# Patient Record
Sex: Female | Born: 1990 | Hispanic: Yes | Marital: Married | Smoking: Never smoker
Health system: Southern US, Community
[De-identification: ages and names within clinical notes are randomized; demographics above are authoritative.]

## PROBLEM LIST (undated history)

## (undated) DIAGNOSIS — Z789 Other specified health status: Secondary | ICD-10-CM

## (undated) DIAGNOSIS — F32A Depression, unspecified: Secondary | ICD-10-CM

## (undated) HISTORY — DX: Depression, unspecified: F32.A

---

## 2016-12-17 LAB — OB RESULTS CONSOLE HEPATITIS B SURFACE ANTIGEN: HEP B S AG: NEGATIVE

## 2016-12-17 LAB — OB RESULTS CONSOLE GC/CHLAMYDIA
Chlamydia: NEGATIVE
Gonorrhea: NEGATIVE

## 2016-12-17 LAB — OB RESULTS CONSOLE VARICELLA ZOSTER ANTIBODY, IGG: Varicella: IMMUNE

## 2016-12-17 LAB — OB RESULTS CONSOLE HIV ANTIBODY (ROUTINE TESTING): HIV: NONREACTIVE

## 2016-12-17 LAB — OB RESULTS CONSOLE RPR: RPR: NONREACTIVE

## 2016-12-19 DIAGNOSIS — Z6791 Unspecified blood type, Rh negative: Secondary | ICD-10-CM | POA: Insufficient documentation

## 2017-04-20 ENCOUNTER — Other Ambulatory Visit: Payer: Self-pay | Admitting: Obstetrics and Gynecology

## 2017-04-20 DIAGNOSIS — Z3689 Encounter for other specified antenatal screening: Secondary | ICD-10-CM

## 2017-04-30 ENCOUNTER — Ambulatory Visit
Admission: RE | Admit: 2017-04-30 | Discharge: 2017-04-30 | Disposition: A | Discharge status: Home / Self Care | Payer: Managed Care, Other (non HMO) | Source: Ambulatory Visit | Attending: Obstetrics and Gynecology | Admitting: Obstetrics and Gynecology

## 2017-04-30 ENCOUNTER — Ambulatory Visit: Payer: Self-pay

## 2017-04-30 DIAGNOSIS — O321XX0 Maternal care for breech presentation, not applicable or unspecified: Secondary | ICD-10-CM | POA: Insufficient documentation

## 2017-04-30 DIAGNOSIS — Z3689 Encounter for other specified antenatal screening: Secondary | ICD-10-CM | POA: Insufficient documentation

## 2017-04-30 DIAGNOSIS — Z3A29 29 weeks gestation of pregnancy: Secondary | ICD-10-CM | POA: Diagnosis not present

## 2017-04-30 HISTORY — DX: Other specified health status: Z78.9

## 2017-07-02 LAB — OB RESULTS CONSOLE GBS: GBS: NEGATIVE

## 2017-07-16 ENCOUNTER — Other Ambulatory Visit: Payer: Self-pay | Admitting: Obstetrics and Gynecology

## 2017-07-19 ENCOUNTER — Inpatient Hospital Stay
Admission: EM | Admit: 2017-07-19 | Discharge: 2017-07-24 | DRG: 765 | Disposition: A | Discharge status: Home / Self Care | Payer: Managed Care, Other (non HMO) | Attending: Obstetrics and Gynecology | Admitting: Obstetrics and Gynecology

## 2017-07-19 DIAGNOSIS — Z6791 Unspecified blood type, Rh negative: Secondary | ICD-10-CM | POA: Diagnosis not present

## 2017-07-19 DIAGNOSIS — O26893 Other specified pregnancy related conditions, third trimester: Secondary | ICD-10-CM | POA: Diagnosis present

## 2017-07-19 DIAGNOSIS — D6959 Other secondary thrombocytopenia: Secondary | ICD-10-CM | POA: Diagnosis not present

## 2017-07-19 DIAGNOSIS — Z3A41 41 weeks gestation of pregnancy: Secondary | ICD-10-CM | POA: Diagnosis not present

## 2017-07-19 DIAGNOSIS — O9081 Anemia of the puerperium: Secondary | ICD-10-CM | POA: Diagnosis not present

## 2017-07-19 DIAGNOSIS — D62 Acute posthemorrhagic anemia: Secondary | ICD-10-CM | POA: Diagnosis not present

## 2017-07-19 DIAGNOSIS — O480 Post-term pregnancy: Secondary | ICD-10-CM | POA: Diagnosis present

## 2017-07-19 DIAGNOSIS — Z9889 Other specified postprocedural states: Secondary | ICD-10-CM

## 2017-07-19 LAB — CBC
HEMATOCRIT: 37.7 % (ref 35.0–47.0)
HEMOGLOBIN: 12.7 g/dL (ref 12.0–16.0)
MCH: 26.1 pg (ref 26.0–34.0)
MCHC: 33.7 g/dL (ref 32.0–36.0)
MCV: 77.5 fL — AB (ref 80.0–100.0)
Platelets: 191 10*3/uL (ref 150–440)
RBC: 4.86 MIL/uL (ref 3.80–5.20)
RDW: 16.4 % — ABNORMAL HIGH (ref 11.5–14.5)
WBC: 6.1 10*3/uL (ref 3.6–11.0)

## 2017-07-19 LAB — TYPE AND SCREEN
ABO/RH(D): B NEG
Antibody Screen: NEGATIVE

## 2017-07-19 MED ORDER — LIDOCAINE HCL (PF) 1 % IJ SOLN
INTRAMUSCULAR | Status: AC
  Filled 2017-07-19: qty 30

## 2017-07-19 MED ORDER — OXYTOCIN 10 UNIT/ML IJ SOLN
INTRAMUSCULAR | Status: AC
  Filled 2017-07-19: qty 2

## 2017-07-19 MED ORDER — AMMONIA AROMATIC IN INHA
RESPIRATORY_TRACT | Status: AC
  Filled 2017-07-19: qty 10

## 2017-07-19 MED ORDER — LACTATED RINGERS IV SOLN
INTRAVENOUS | Status: DC
  Administered 2017-07-19 – 2017-07-20 (×3): via INTRAVENOUS
  Administered 2017-07-20: 1000 mL via INTRAVENOUS
  Administered 2017-07-21 (×2): via INTRAVENOUS

## 2017-07-19 MED ORDER — TERBUTALINE SULFATE 1 MG/ML IJ SOLN: 0.2500 mg | Freq: Once | INTRAMUSCULAR | Status: DC | PRN

## 2017-07-19 MED ORDER — OXYTOCIN 40 UNITS IN LACTATED RINGERS INFUSION - SIMPLE MED
INTRAVENOUS | Status: AC
  Administered 2017-07-20: 2 m[IU]/min via INTRAVENOUS
  Filled 2017-07-19: qty 1000

## 2017-07-19 MED ORDER — MISOPROSTOL 200 MCG PO TABS
ORAL_TABLET | ORAL | Status: AC
  Filled 2017-07-19: qty 4

## 2017-07-19 MED ORDER — MISOPROSTOL 25 MCG QUARTER TABLET
25.0000 ug | ORAL_TABLET | ORAL | Status: DC | PRN
  Administered 2017-07-19 – 2017-07-20 (×4): 25 ug via VAGINAL
  Filled 2017-07-19 (×4): qty 1

## 2017-07-19 MED ORDER — BUTORPHANOL TARTRATE 2 MG/ML IJ SOLN: 1.0000 mg | INTRAMUSCULAR | Status: DC | PRN

## 2017-07-20 MED ORDER — SODIUM CHLORIDE FLUSH 0.9 % IV SOLN
INTRAVENOUS | Status: AC
  Filled 2017-07-20: qty 10

## 2017-07-20 MED ORDER — SODIUM CHLORIDE 0.9 % IJ SOLN
INTRAMUSCULAR | Status: AC
  Filled 2017-07-20: qty 50

## 2017-07-20 MED ORDER — TERBUTALINE SULFATE 1 MG/ML IJ SOLN: 0.2500 mg | Freq: Once | INTRAMUSCULAR | Status: DC | PRN

## 2017-07-20 MED ORDER — OXYTOCIN 40 UNITS IN LACTATED RINGERS INFUSION - SIMPLE MED
1.0000 m[IU]/min | INTRAVENOUS | Status: DC
  Administered 2017-07-20: 2 m[IU]/min via INTRAVENOUS
  Administered 2017-07-21: 30 m[IU]/min via INTRAVENOUS
  Administered 2017-07-21: 250 mL via INTRAVENOUS
  Filled 2017-07-20 (×2): qty 1000

## 2017-07-20 NOTE — Plan of Care (Signed)
MD ordered for patient to eat and place cytotec in an hour.

## 2017-07-20 NOTE — Progress Notes (Signed)
Isabella Bennett is a 26 y.o. G1P0 at [redacted]w[redacted]d by dating admitted for IOL for postdates.   Subjective: I am feeling a little something  Objective: BP 130/67 (BP Location: Right Arm)   Pulse 75   Temp 98.2 F (36.8 C) (Oral)   Resp 18   Ht 5\' 2"  (1.575 m)   Wt 74.8 kg (165 lb)   LMP 10/04/2016   BMI 30.18 kg/m  I/O last 3 completed shifts: In: 1252 [I.V.:1252] Out: -  No intake/output data recorded.  FHT: 135, Cat 1, +accels, no decels  UC:   q 1-3 mins, lasting 60-65 sec, palp as sl mod SVE:   Dilation: 3 Effacement (%): 80 Station: -3 Exam by:: ER RN   Labs: Lab Results  Component Value Date   WBC 6.1 07/19/2017   HGB 12.7 07/19/2017   HCT 37.7 07/19/2017   MCV 77.5 (L) 07/19/2017   PLT 191 07/19/2017    Assessment / Plan: A:1. IUP at 41 2/7 weeks 2. Foley bulb/Pitocin IOL 3. GBs neg P: Continue ext and fetal monitors. 2. Pitocin at 10 mun/min and Dr Ouida Sills is aware and agrees with this plan of care    [name] 07/20/2017, 11:05 PM

## 2017-07-20 NOTE — H&P (Signed)
Isabella Bennett is a 26 y.o. female presenting for post dates induction . Cytotec last pm .  Fetal pericardia effusion incidental , normal growth u/s  pelviectasis resolved, low lying placenta  Resolved  OB History    Gravida Para Term Preterm AB Living   1         0   SAB TAB Ectopic Multiple Live Births                 Past Medical History:  Diagnosis Date  . Medical history non-contributory    History reviewed. No pertinent surgical history. Family History: family history is not on file. Social History:  reports that she has never smoked. She has never used smokeless tobacco. She reports that she does not drink alcohol or use drugs.     Maternal Diabetes: No Genetic Screening: Declined Maternal Ultrasounds/Referrals: Normal Fetal Ultrasounds or other Referrals:  Other: as above  Maternal Substance Abuse:  No Significant Maternal Medications:  None Significant Maternal Lab Results:  None Other Comments:  None  ROS History Dilation: 1 Effacement (%): 50 Station: -3 Exam by:: RS Blood pressure 111/79, pulse 91, temperature 98.5 F (36.9 C), temperature source Oral, resp. rate 16, height 5\' 2"  (1.575 m), weight 74.8 kg (165 lb), last menstrual period 10/04/2016. Exam Physical Exam   0745  Lungs CTA  CV RRR  adb gravid  Reassuring fetal monitoring , ctx mildly painful q 2 minutes  cx high TFT /70 / -3 VTX  Prenatal labs: ABO, Rh: --/--/B NEG (09/16 2051) Antibody: NEG (09/16 2051) Rubella:   RPR: Nonreactive (02/14 0000)  HBsAg: Negative (02/14 0000)  HIV: Non-reactive (02/14 0000)  GBS: Negative (08/30 0000)   Assessment/Plan: postdates induction  Continue with induction    Isabella Bennett Isabella Bennett 07/20/2017, 7:43 AM

## 2017-07-20 NOTE — Progress Notes (Signed)
Isabella Bennett is a 25 y.o. G1P0 at [redacted]w[redacted]d by dating admitted for post-dates IOL. Cytotec being used per orders of Dr Palma Holter. Subjective: "I feel a little discomfort in my lower back"  Objective: BP 120/76 (BP Location: Right Arm)   Pulse 85   Temp 98 F (36.7 C)   Resp 16   Ht 5\' 2"  (1.575 m)   Wt 74.8 kg (165 lb)   LMP 10/04/2016   BMI 30.18 kg/m  No intake/output data recorded. Total I/O In: 1252 [I.V.:1252] Out: -   FHT:130, +accels, no decels, Cat 1 UC:  q1-3 mins,  SVE:   Dilation: Fingertip Effacement (%): 50 Station: -3 Exam by:: Schermerhorn  Labs: Lab Results  Component Value Date   WBC 6.1 07/19/2017   HGB 12.7 07/19/2017   HCT 37.7 07/19/2017   MCV 77.5 (L) 07/19/2017   PLT 191 07/19/2017    Assessment / Plan: A:1. IUP at 41 1/7 weeks 2. Early labor pattern P: Will place Cytotec at 1300 again since vtx is -3 station 2. Continue to monitor UC/FHT's  [name] 07/20/2017, 12:21 PM

## 2017-07-20 NOTE — Progress Notes (Signed)
Isabella Bennett is a 26 y.o. G1P0 at [redacted]w[redacted]d by dating  admitted for postdates IOL.   Subjective: I am feeling some of the contractions  Objective: BP 129/87 (BP Location: Right Arm)   Pulse 94   Temp 98 F (36.7 C) (Oral)   Resp 18   Ht 5\' 2"  (1.575 m)   Wt 74.8 kg (165 lb)   LMP 10/04/2016   BMI 30.18 kg/m  I/O last 3 completed shifts: In: 1252 [I.V.:1252] Out: -  No intake/output data recorded.  FHT: q 140, +accel, no decels, Cat 1 UC:   q 1-3 mins SVE:   Dilation: 4 Effacement (%): 90 Station: -3 Exam by:: C. Jones  Labs: Lab Results  Component Value Date   WBC 6.1 07/19/2017   HGB 12.7 07/19/2017   HCT 37.7 07/19/2017   MCV 77.5 (L) 07/19/2017   PLT 191 07/19/2017   2316 AROM for large amt of clear fluid. FHR 140's after AROM.   Assessment / Plan: A:1. IUP at 41 1/7 weeks. 2. GBS neg P: 1. Pitocin at 10 mun/min 2. IUPC inserted.   [name] 07/20/2017, 11:25 PM

## 2017-07-20 NOTE — Progress Notes (Signed)
Isabella Bennett is a 26 y.o. G1P0 at [redacted]w[redacted]d by dating  admitted fo IOL due to postdates. Pt is 41 1/7 weeks.  Subjective: Resting   Objective: BP 120/76 (BP Location: Right Arm)   Pulse 85   Temp 98 F (36.7 C)   Resp 16   Ht 5\' 2"  (1.575 m)   Wt 74.8 kg (165 lb)   LMP 10/04/2016   BMI 30.18 kg/m  No intake/output data recorded. Total I/O In: 1252 [I.V.:1252] Out: -   FHT: 140, accel to 155 UC:   q 1-5 mins SVE:   Dilation: Fingertip Effacement (%): 50 Station: -3 Exam by:: Schermerhorn  Labs: Lab Results  Component Value Date   WBC 6.1 07/19/2017   HGB 12.7 07/19/2017   HCT 37.7 07/19/2017   MCV 77.5 (L) 07/19/2017   PLT 191 07/19/2017    Assessment / Plan: A:1. IUP at 41 1/7 weeks for IOL 2. GBs neg  P: Continue Cytotec as scheduled.  2. Monitor UC/FHT's  [name] 07/20/2017, 10:59 AM

## 2017-07-20 NOTE — Progress Notes (Signed)
Isabella Bennett is a 26 y.o. G1P0 at [redacted]w[redacted]d by dating  admitted for IOL for postdates  Subjective: I would like to take a break  Objective: BP 123/81 (BP Location: Right Arm)   Pulse 80   Temp 98 F (36.7 C) (Oral)   Resp 16   Ht 5\' 2"  (1.575 m)   Wt 74.8 kg (165 lb)   LMP 10/04/2016   BMI 30.18 kg/m  No intake/output data recorded. Total I/O In: 1252 [I.V.:1252] Out: -   FHT:  145, Cat 1 UC:   q 1-3 mins,  SVE:   Dilation: 2 Effacement (%): 60 Station: -3 Exam by:: Tyrae Alcoser  Labs: Lab Results  Component Value Date   WBC 6.1 07/19/2017   HGB 12.7 07/19/2017   HCT 37.7 07/19/2017   MCV 77.5 (L) 07/19/2017   PLT 191 07/19/2017    Assessment / Plan: A:1. IUP at 41 1/7 weeks 2. GBs neg P: 1. Disc with Dr Ouida Sills status of pt. Will give pt a break and allow her to shower and eat and then  Start with a foley bulb and Pitocin.     Isabella Bennett 07/20/2017, 5:46 PM

## 2017-07-20 NOTE — Progress Notes (Signed)
Kennadie Beckett is a 26 y.o. G1P0 at [redacted]w[redacted]d by dating admitted for postdates IOL.   Subjective: I am comfortable   Objective: BP 130/67 (BP Location: Right Arm)   Pulse 75   Temp 98.2 F (36.8 C) (Oral)   Resp 18   Ht 5\' 2"  (1.575 m)   Wt 74.8 kg (165 lb)   LMP 10/04/2016   BMI 30.18 kg/m  I/O last 3 completed shifts: In: 1252 [I.V.:1252] Out: -  No intake/output data recorded.  FHT: 140, Cat 1, no decels.  UC:  q 1-5 mins SVE:   Dilation: 2 Effacement (%): 60 Station: -3 Exam by:: Teruko Joswick  Labs: Lab Results  Component Value Date   WBC 6.1 07/19/2017   HGB 12.7 07/19/2017   HCT 37.7 07/19/2017   MCV 77.5 (L) 07/19/2017   PLT 191 07/19/2017  Speculum inserted and cx visualized and betadine prep to the cx. Foley bulb with 30 ml balloon inserted and taped tightly to the Rt leg.   Assessment / Plan: A1. Postdates IOL with foley bulb and Pitocin 2. GBS neg P: 1. Start Pitocin per protocol. 2. Foley bulb in place and taped tightly to the Rt leg.    [name] 07/20/2017, 7:37 PM

## 2017-07-21 ENCOUNTER — Inpatient Hospital Stay: Payer: Managed Care, Other (non HMO) | Admitting: Anesthesiology

## 2017-07-21 ENCOUNTER — Encounter
Admission: EM | Disposition: A | Discharge status: Home / Self Care | Payer: Self-pay | Source: Home / Self Care | Attending: Obstetrics and Gynecology

## 2017-07-21 ENCOUNTER — Encounter: Payer: Self-pay | Admitting: Anesthesiology

## 2017-07-21 DIAGNOSIS — Z9889 Other specified postprocedural states: Secondary | ICD-10-CM

## 2017-07-21 LAB — RPR: RPR: NONREACTIVE

## 2017-07-21 SURGERY — Surgical Case
Anesthesia: Epidural | Wound class: Clean Contaminated

## 2017-07-21 MED ORDER — OXYCODONE HCL 5 MG/5ML PO SOLN: 5.0000 mg | Freq: Once | ORAL | Status: DC | PRN

## 2017-07-21 MED ORDER — FENTANYL 2.5 MCG/ML W/ROPIVACAINE 0.15% IN NS 100 ML EPIDURAL (ARMC)
EPIDURAL | Status: AC
  Filled 2017-07-21: qty 100

## 2017-07-21 MED ORDER — SODIUM CHLORIDE FLUSH 0.9 % IV SOLN
INTRAVENOUS | Status: AC
  Filled 2017-07-21: qty 20

## 2017-07-21 MED ORDER — SODIUM CHLORIDE 0.9 % IJ SOLN
INTRAMUSCULAR | Status: DC | PRN
  Administered 2017-07-21: 50 mL via INTRAVENOUS

## 2017-07-21 MED ORDER — SIMETHICONE 80 MG PO CHEW
80.0000 mg | CHEWABLE_TABLET | Freq: Three times a day (TID) | ORAL | Status: DC
  Administered 2017-07-21 – 2017-07-24 (×9): 80 mg via ORAL
  Filled 2017-07-21 (×9): qty 1

## 2017-07-21 MED ORDER — SOD CITRATE-CITRIC ACID 500-334 MG/5ML PO SOLN
ORAL | Status: AC
  Filled 2017-07-21: qty 15

## 2017-07-21 MED ORDER — LACTATED RINGERS IV SOLN
INTRAVENOUS | Status: DC
  Administered 2017-07-22 (×2): via INTRAVENOUS

## 2017-07-21 MED ORDER — DIBUCAINE 1 % RE OINT: 1.0000 "application " | TOPICAL_OINTMENT | RECTAL | Status: DC | PRN

## 2017-07-21 MED ORDER — CEFAZOLIN SODIUM-DEXTROSE 2-4 GM/100ML-% IV SOLN
2.0000 g | Freq: Once | INTRAVENOUS | Status: AC
  Administered 2017-07-21: 2 g via INTRAVENOUS
  Filled 2017-07-21: qty 100

## 2017-07-21 MED ORDER — ONDANSETRON HCL 4 MG/2ML IJ SOLN
INTRAMUSCULAR | Status: AC
  Filled 2017-07-21: qty 2

## 2017-07-21 MED ORDER — SIMETHICONE 80 MG PO CHEW
80.0000 mg | CHEWABLE_TABLET | ORAL | Status: DC
  Administered 2017-07-22 – 2017-07-24 (×3): 80 mg via ORAL
  Filled 2017-07-21 (×3): qty 1

## 2017-07-21 MED ORDER — ACETAMINOPHEN 325 MG PO TABS: 650.0000 mg | ORAL_TABLET | ORAL | Status: DC | PRN

## 2017-07-21 MED ORDER — WITCH HAZEL-GLYCERIN EX PADS: 1.0000 "application " | MEDICATED_PAD | CUTANEOUS | Status: DC | PRN

## 2017-07-21 MED ORDER — OXYCODONE-ACETAMINOPHEN 5-325 MG PO TABS
1.0000 | ORAL_TABLET | ORAL | Status: DC | PRN
  Administered 2017-07-21 – 2017-07-24 (×7): 1 via ORAL
  Filled 2017-07-21 (×8): qty 1

## 2017-07-21 MED ORDER — PRENATAL MULTIVITAMIN CH
1.0000 | ORAL_TABLET | Freq: Every day | ORAL | Status: DC
  Administered 2017-07-22 – 2017-07-24 (×3): 1 via ORAL
  Filled 2017-07-21 (×3): qty 1

## 2017-07-21 MED ORDER — SIMETHICONE 80 MG PO CHEW: 80.0000 mg | CHEWABLE_TABLET | ORAL | Status: DC | PRN

## 2017-07-21 MED ORDER — DEXTROSE 5 % IV SOLN
500.0000 mg | Freq: Once | INTRAVENOUS | Status: AC
  Administered 2017-07-21: 500 mg via INTRAVENOUS
  Filled 2017-07-21: qty 500

## 2017-07-21 MED ORDER — OXYTOCIN 40 UNITS IN LACTATED RINGERS INFUSION - SIMPLE MED
2.5000 [IU]/h | INTRAVENOUS | Status: AC
  Administered 2017-07-22: 2.5 [IU]/h via INTRAVENOUS
  Filled 2017-07-21: qty 1000

## 2017-07-21 MED ORDER — DIPHENHYDRAMINE HCL 25 MG PO CAPS: 25.0000 mg | ORAL_CAPSULE | Freq: Four times a day (QID) | ORAL | Status: DC | PRN

## 2017-07-21 MED ORDER — BUPIVACAINE LIPOSOME 1.3 % IJ SUSP: 20.0000 mL | Freq: Once | INTRAMUSCULAR | Status: DC

## 2017-07-21 MED ORDER — MENTHOL 3 MG MT LOZG
1.0000 | LOZENGE | OROMUCOSAL | Status: DC | PRN
  Filled 2017-07-21: qty 9

## 2017-07-21 MED ORDER — COCONUT OIL OIL: 1.0000 "application " | TOPICAL_OIL | Status: DC | PRN

## 2017-07-21 MED ORDER — ZOLPIDEM TARTRATE 5 MG PO TABS: 5.0000 mg | ORAL_TABLET | Freq: Every evening | ORAL | Status: DC | PRN

## 2017-07-21 MED ORDER — FENTANYL CITRATE (PF) 100 MCG/2ML IJ SOLN
INTRAMUSCULAR | Status: AC
  Filled 2017-07-21: qty 2

## 2017-07-21 MED ORDER — FENTANYL CITRATE (PF) 100 MCG/2ML IJ SOLN
INTRAMUSCULAR | Status: DC | PRN
  Administered 2017-07-21 (×2): 100 ug via INTRAVENOUS

## 2017-07-21 MED ORDER — PHENYLEPHRINE 40 MCG/ML (10ML) SYRINGE FOR IV PUSH (FOR BLOOD PRESSURE SUPPORT)
PREFILLED_SYRINGE | INTRAVENOUS | Status: DC | PRN
  Administered 2017-07-21: 100 ug via INTRAVENOUS

## 2017-07-21 MED ORDER — PHENYLEPHRINE HCL 10 MG/ML IJ SOLN
INTRAMUSCULAR | Status: AC
  Filled 2017-07-21: qty 1

## 2017-07-21 MED ORDER — SENNOSIDES-DOCUSATE SODIUM 8.6-50 MG PO TABS
2.0000 | ORAL_TABLET | ORAL | Status: DC
  Administered 2017-07-22 – 2017-07-24 (×3): 2 via ORAL
  Filled 2017-07-21 (×4): qty 2

## 2017-07-21 MED ORDER — METHYLERGONOVINE MALEATE 0.2 MG/ML IJ SOLN
INTRAMUSCULAR | Status: AC
  Filled 2017-07-21: qty 1

## 2017-07-21 MED ORDER — BUPIVACAINE LIPOSOME 1.3 % IJ SUSP
INTRAMUSCULAR | Status: DC | PRN
  Administered 2017-07-21: 20 mL

## 2017-07-21 MED ORDER — BUPIVACAINE LIPOSOME 1.3 % IJ SUSP
INTRAMUSCULAR | Status: AC
  Filled 2017-07-21: qty 20

## 2017-07-21 MED ORDER — ONDANSETRON HCL 4 MG/2ML IJ SOLN
INTRAMUSCULAR | Status: DC | PRN
  Administered 2017-07-21: 4 mg via INTRAVENOUS

## 2017-07-21 MED ORDER — PROPOFOL 10 MG/ML IV BOLUS
INTRAVENOUS | Status: AC
  Filled 2017-07-21: qty 20

## 2017-07-21 MED ORDER — BUPIVACAINE HCL (PF) 0.5 % IJ SOLN
INTRAMUSCULAR | Status: AC
  Filled 2017-07-21: qty 30

## 2017-07-21 MED ORDER — IBUPROFEN 600 MG PO TABS
600.0000 mg | ORAL_TABLET | Freq: Four times a day (QID) | ORAL | Status: DC
  Administered 2017-07-21 – 2017-07-24 (×10): 600 mg via ORAL
  Filled 2017-07-21 (×11): qty 1

## 2017-07-21 MED ORDER — FENTANYL CITRATE (PF) 100 MCG/2ML IJ SOLN
25.0000 ug | INTRAMUSCULAR | Status: DC | PRN
  Administered 2017-07-21 (×2): 50 ug via INTRAVENOUS
  Filled 2017-07-21: qty 2

## 2017-07-21 MED ORDER — BUPIVACAINE HCL (PF) 0.25 % IJ SOLN
INTRAMUSCULAR | Status: DC | PRN
  Administered 2017-07-21: 5 mL via EPIDURAL

## 2017-07-21 MED ORDER — BUPIVACAINE HCL (PF) 0.5 % IJ SOLN: 30.0000 mL | Freq: Once | INTRAMUSCULAR | Status: DC

## 2017-07-21 MED ORDER — SUCCINYLCHOLINE CHLORIDE 200 MG/10ML IV SOSY
PREFILLED_SYRINGE | INTRAVENOUS | Status: AC
  Filled 2017-07-21: qty 10

## 2017-07-21 MED ORDER — OXYCODONE HCL 5 MG PO TABS: 5.0000 mg | ORAL_TABLET | Freq: Once | ORAL | Status: DC | PRN

## 2017-07-21 MED ORDER — TETANUS-DIPHTH-ACELL PERTUSSIS 5-2.5-18.5 LF-MCG/0.5 IM SUSP: 0.5000 mL | Freq: Once | INTRAMUSCULAR | Status: DC

## 2017-07-21 MED ORDER — OXYCODONE-ACETAMINOPHEN 5-325 MG PO TABS: 2.0000 | ORAL_TABLET | ORAL | Status: DC | PRN

## 2017-07-21 MED ORDER — SODIUM CHLORIDE FLUSH 0.9 % IV SOLN
INTRAVENOUS | Status: AC
  Filled 2017-07-21: qty 10

## 2017-07-21 MED ORDER — SOD CITRATE-CITRIC ACID 500-334 MG/5ML PO SOLN
30.0000 mL | Freq: Once | ORAL | Status: AC
  Administered 2017-07-21: 30 mL via ORAL

## 2017-07-21 MED ORDER — SODIUM CHLORIDE 0.9 % IJ SOLN
INTRAMUSCULAR | Status: AC
  Filled 2017-07-21: qty 50

## 2017-07-21 MED ORDER — LIDOCAINE HCL (PF) 2 % IJ SOLN
INTRAMUSCULAR | Status: AC
  Filled 2017-07-21: qty 2

## 2017-07-21 MED ORDER — FENTANYL 2.5 MCG/ML W/ROPIVACAINE 0.15% IN NS 100 ML EPIDURAL (ARMC)
EPIDURAL | Status: DC | PRN
  Administered 2017-07-21: 10 mL/h via EPIDURAL
  Administered 2017-07-21: 250 ug via EPIDURAL

## 2017-07-21 MED ORDER — LIDOCAINE 2% (20 MG/ML) 5 ML SYRINGE
INTRAMUSCULAR | Status: DC | PRN
  Administered 2017-07-21 (×3): 100 mg via INTRAVENOUS

## 2017-07-21 MED ORDER — BUPIVACAINE HCL (PF) 0.5 % IJ SOLN
INTRAMUSCULAR | Status: DC | PRN
  Administered 2017-07-21: 30 mL

## 2017-07-21 SURGICAL SUPPLY — 23 items
BARRIER ADHS 3X4 INTERCEED (GAUZE/BANDAGES/DRESSINGS) ×3 IMPLANT
CANISTER SUCT 3000ML PPV (MISCELLANEOUS) ×3 IMPLANT
CHLORAPREP W/TINT 26ML (MISCELLANEOUS) ×3 IMPLANT
DRSG TELFA 3X8 NADH (GAUZE/BANDAGES/DRESSINGS) ×3 IMPLANT
ELECT CAUTERY BLADE 6.4 (BLADE) ×3 IMPLANT
ELECT REM PT RETURN 9FT ADLT (ELECTROSURGICAL) ×3
ELECTRODE REM PT RTRN 9FT ADLT (ELECTROSURGICAL) ×1 IMPLANT
GAUZE SPONGE 4X4 12PLY STRL (GAUZE/BANDAGES/DRESSINGS) ×3 IMPLANT
GLOVE BIO SURGEON STRL SZ8 (GLOVE) ×3 IMPLANT
GOWN STRL REUS W/ TWL LRG LVL3 (GOWN DISPOSABLE) ×2 IMPLANT
GOWN STRL REUS W/ TWL XL LVL3 (GOWN DISPOSABLE) ×1 IMPLANT
GOWN STRL REUS W/TWL LRG LVL3 (GOWN DISPOSABLE) ×4
GOWN STRL REUS W/TWL XL LVL3 (GOWN DISPOSABLE) ×2
NDL SAFETY 22GX1.5 (NEEDLE) ×3 IMPLANT
NS IRRIG 1000ML POUR BTL (IV SOLUTION) ×3 IMPLANT
PACK C SECTION AR (MISCELLANEOUS) ×3 IMPLANT
PAD OB MATERNITY 4.3X12.25 (PERSONAL CARE ITEMS) ×3 IMPLANT
PAD PREP 24X41 OB/GYN DISP (PERSONAL CARE ITEMS) ×3 IMPLANT
STRAP SAFETY BODY (MISCELLANEOUS) ×3 IMPLANT
SUT CHROMIC 1 CTX 36 (SUTURE) ×9 IMPLANT
SUT PLAIN GUT 0 (SUTURE) ×6 IMPLANT
SUT VIC AB 0 CT1 36 (SUTURE) ×6 IMPLANT
SYR 30ML LL (SYRINGE) ×6 IMPLANT

## 2017-07-21 NOTE — Transfer of Care (Signed)
Immediate Anesthesia Transfer of Care Note  Patient: Isabella Bennett  Procedure(s) Performed: Procedure(s): CESAREAN SECTION  Patient Location: Nursing Unit  Anesthesia Type:Epidural  Level of Consciousness: awake, alert  and oriented  Airway & Oxygen Therapy: Patient Spontanous Breathing  Post-op Assessment: Post -op Vital signs reviewed and stable  Post vital signs: stable  Last Vitals:  Vitals:   07/21/17 1501 07/21/17 1614  BP: 123/66 121/80  Pulse: (!) 117 80  Resp:  (!) 24  Temp:  37.4 C  SpO2:  98%    Last Pain:  Vitals:   07/21/17 1614  TempSrc: Axillary  PainSc:          Complications: No apparent anesthesia complications

## 2017-07-21 NOTE — Discharge Summary (Signed)
Obstetric Discharge Summary   Patient ID: Patient Name: Isabella Bennett DOB: 1991-01-24 MRN: [id number]  Date of Admission: 07/19/2017 Date of Discharge: 07/24/17 Primary OB: [facility] Gestational Age at Delivery: [redacted]w[redacted]d   Antepartum complications:none Admitting Diagnosis: post dates induction   Secondary Diagnoses: Patient Active Problem List   Diagnosis Date Noted  . Indication for care or intervention related to labor and delivery 07/19/2017    Augmentation: Pitocin and Cytotec Complications: Fail to progress Intrapartum complications/course:  Induction with slow progression on day#2 . She did not progress past 7 cm .  LTCS with wedged fetus delivered female 9/0# , CPD documented Date of Delivery: 1532 on 07/21/17 Delivered By: Gwen Her Schermerhorn MD Delivery Type: primary cesarean section, low transverse incision Anesthesia: epidural Placenta: sponatneous Laceration: None Episiotomy: none  Newborn Data: Live born female  Birth Weight: 9 lb (4082 g) APGAR: 9, 10      Postpartum Course  Patient had an uncomplicated postpartum course.  By time of discharge on PPD#3POD#,3 her pain was controlled on oral pain medications; she had appropriate lochia and was ambulating, voiding without difficulty and tolerating regular diet.  She was deemed stable for discharge to home.     Labs: CBC Latest Ref Rng & Units 07/19/2017  WBC 3.6 - 11.0 K/uL 6.1  Hemoglobin 12.0 - 16.0 g/dL 12.7  Hematocrit 35.0 - 47.0 % 37.7  Platelets 150 - 440 K/uL 191   B NEG  Physical exam:  BP 123/66   Pulse (!) 117   Temp 99.7 F (37.6 C) (Oral)   Resp 18   Ht 5\' 2"  (1.575 m)   Wt 74.8 kg (165 lb)   LMP 10/04/2016   SpO2 98%   Breastfeeding? Unknown   BMI 30.18 kg/m  General: alert and no distress Pulm: normal respiratory effort Lochia: appropriate Abdomen: soft, NT Uterine Fundus: firm, below umbilicus Incision: c/d/i, healing well, no significant drainage, no dehiscence, no  significant erythema Extremities: No evidence of DVT seen on physical exam. No lower extremity edema.   Disposition: stable, discharge to home Baby Feeding: breastmilk Baby Disposition: home with mom  Contraception: to be decided at 6 weeks  Prenatal Labs:  ABO, Rh: --/--/B NEG (09/16 2051) Antibody: NEG (09/16 2051) Rubella:  Immubne RPR: Nonreactive (02/14 0000)  HBsAg: Negative (02/14 0000)  HIV: Non-reactive (02/14 0000)  GBS: Negative (08/30 0000)  Varicella Immune   Plan:  Amiera Molesworth was discharged to home in good condition. Follow-up appointment at [facility] in 2 weeks  Discharge Instructions: Per After Visit Summary. Activity: Advance as tolerated. Pelvic rest for 6 weeks.  Refer to After Visit Summary Diet: Regular Discharge Medications: Percocet Rx and PNV with Fe  Outpatient follow up: 2 weeks pp with Dr Chalmers Guest   Signed:  [name], RN, MSN, CNM, FNP

## 2017-07-21 NOTE — Anesthesia Preprocedure Evaluation (Addendum)
Anesthesia Evaluation  Patient identified by MRN, date of birth, ID band Patient awake    Reviewed: Allergy & Precautions, H&P , NPO status , [name] Chart, lab work & pertinent test results, reviewed documented beta blocker date and time   Airway Mallampati: II  TM Distance: >3 FB Neck ROM: full    Dental  (+) Chipped   Pulmonary neg pulmonary ROS,           Cardiovascular Exercise Tolerance: Good (-) hypertensionnegative cardio ROS       Neuro/Psych    GI/Hepatic negative GI ROS,   Endo/Other    Renal/GU   negative genitourinary   Musculoskeletal   Abdominal   Peds  Hematology negative hematology ROS (+)   Anesthesia Other Findings Past Medical History: No date: Medical history non-contributory  History reviewed. No pertinent surgical history.  BMI    Body Mass Index:  30.18 kg/m      Reproductive/Obstetrics (+) Pregnancy                            Anesthesia Physical Anesthesia Plan  ASA: II  Anesthesia Plan: Epidural   Post-op Pain Management:    Induction:   PONV Risk Score and Plan:   Airway Management Planned:   Additional Equipment:   Intra-op Plan:   Post-operative Plan:   Informed Consent: I have reviewed the patients History and Physical, chart, labs and discussed the procedure including the risks, benefits and alternatives for the proposed anesthesia with the patient or authorized representative who has indicated his/her understanding and acceptance.     Plan Discussed with: CRNA  Anesthesia Plan Comments:         Anesthesia Quick Evaluation

## 2017-07-21 NOTE — Progress Notes (Addendum)
RN in room to assess, FHR decel noted. Pt lying on Rt side resting quietly with eyes closed, arouses easily. States she feels occasional pressure but none at present. Pt encouraged to remain side lying, offered peanut ball, pt refused.

## 2017-07-21 NOTE — Progress Notes (Signed)
Isabella Bennett is a 26 y.o. G1P0 at [redacted]w[redacted]d by dating  admitted for IOL at 80 2/7 weeks.   Subjective: Comfortable   Objective: BP 110/63   Pulse 83   Temp 98.1 F (36.7 C) (Oral)   Resp 18   Ht 5\' 2"  (1.575 m)   Wt 74.8 kg (165 lb)   LMP 10/04/2016   SpO2 98%   BMI 30.18 kg/m  I/O last 3 completed shifts: In: 1252 [I.V.:1252] Out: -  No intake/output data recorded.  FHT: 140, +accels, no further decels except one decel to 90 after epidural noted, Cat 1 strip UC:   q 1-2 mins.  SVE:   Dilation: 4.5 Effacement (%): 90 Station: -2 Exam by:: ER RN   Labs: Lab Results  Component Value Date   WBC 6.1 07/19/2017   HGB 12.7 07/19/2017   HCT 37.7 07/19/2017   MCV 77.5 (L) 07/19/2017   PLT 191 07/19/2017    Assessment / Plan: A:1. IUP at 41 2/7 weeks 2. Active Labor P: Continue labor and antic SVD. 2. Continue epidural  [name] 07/21/2017, 7:16 AM

## 2017-07-21 NOTE — Lactation Note (Signed)
This note was copied from a baby's chart. Lactation Consultation Note  Patient Name: Isabella Bennett Today's Date: 07/21/2017   I was asked to help with first breastfeeding for this 9lb baby and P1 Mom after C/S. Baby is very alert and vigorous, but has strong affinity for sucking her tongue. When placed near the breast, she closed her mouth and sucked her tongue. I did some massage around cheeks and back and then tried some suck training with gloved finger. After a couple attempts, she would open her mouth wide for the finger tap on jaw and suck finger correctly, but then not do the same thing at breast. Her best attempt was pinching the end of nipples and only lasted a few minutes. Since she was acting quite hungry and getting frustrated with poor latch (and 9 lb), I applied 24 mm nipple shield. It took her a few tried, but she finally developed a deep rhythmic suck-swallow pattern for 15 minutes. Mom will need help with nipple shield at next few sessions. I will F/U in am. (Blood sugar was then 58)  Maternal Data    Feeding Feeding Type: Breast Fed  LATCH Score Latch: Repeated attempts needed to sustain latch, nipple held in mouth throughout feeding, stimulation needed to elicit sucking reflex. (poor latch wihtout shield; sucks tongue)  Audible Swallowing: A few with stimulation  Type of Nipple: Everted at rest and after stimulation  Comfort (Breast/Nipple): Soft / non-tender  Hold (Positioning): Assistance needed to correctly position infant at breast and maintain latch.  LATCH Score: 7  Interventions Interventions: Breast feeding basics reviewed;Assisted with latch;Skin to skin;Breast massage;Hand express;Breast compression;Adjust position;Support pillows;Position options;Expressed milk  Lactation Tools Discussed/Used Tools: Nipple [place] [employer]   Consult Status Consult Status: Follow-up Date: 07/22/17    Roque Cash 07/21/2017, 5:52 PM

## 2017-07-21 NOTE — Anesthesia Procedure Notes (Signed)
Epidural Patient location during procedure: OB  Staffing Anesthesiologist: Gunnar Bulla Performed: anesthesiologist   Preanesthetic Checklist Completed: patient identified, site marked, surgical consent, pre-op evaluation, timeout performed, IV checked, risks and benefits discussed and monitors and equipment checked  Epidural Patient position: sitting Prep: Betadine Patient monitoring: heart rate, continuous pulse ox and blood pressure Approach: midline Location: L4-L5 Injection technique: LOR saline  Needle:  Needle type: Tuohy  Needle gauge: 18 G Needle length: 9 cm and 9 Catheter type: closed end flexible Catheter size: 20 Guage Test dose: negative and 1.5% lidocaine with Epi 1:200 K  Assessment Sensory level: T10 Events: blood not aspirated, injection not painful, no injection resistance, negative IV test and no paresthesia  Additional Notes   Patient tolerated the insertion well without complications.Reason for block:procedure for pain

## 2017-07-21 NOTE — Consult Note (Signed)
Neonatology Note:   Attendance at C-section:    I was asked by Dr. Ouida Sills to attend this primary C/S at 41 3/7 weeks due to FTP. The mother is a G1P0 B neg, GBS neg being induced for post-dates. Fetal ultrasound showed resolved pelviectasis and an incidental finding of pericardial effusion. ROM 16 hours prior to  delivery, fluid clear. Infant vigorous with good spontaneous cry and tone. Delayed cord clamping was done. Needed only minimal bulb suctioning. Ap 9/10. Lungs clear to ausc in DR. To CN to care of Pediatrician.   Real Cons, MD

## 2017-07-21 NOTE — Anesthesia Post-op Follow-up Note (Signed)
Anesthesia QCDR form completed.        

## 2017-07-21 NOTE — Brief Op Note (Signed)
07/19/2017 - 07/21/2017  4:02 PM  PATIENT:  Isabella Bennett  26 y.o. female  PRE-OPERATIVE DIAGNOSIS:  * active phase arrest   POST-OPERATIVE DIAGNOSIS:  *active phase arrest ., CPD  PROCEDURE:  Procedure(s): CESAREAN SECTION  SURGEON:  Surgeon(s) and Role:    * Schermerhorn, Gwen Her, MD - Primary  PHYSICIAN ASSISTANT: scrub tech   ASSISTANTS: none   ANESTHESIA:   epidural  EBL:  Total I/O In: 900 [I.V.:900] Out: 2388 [Urine:750; Blood:1638]  BLOOD ADMINISTERED:none  DRAINS: Urinary Catheter (Foley)   LOCAL MEDICATIONS USED:  MARCAINE    and BUPIVICAINE   SPECIMEN:  No Specimen  DISPOSITION OF SPECIMEN:  N/A  COUNTS:  YES  TOURNIQUET:  * No tourniquets in log *  DICTATION: .Other Dictation: Dictation Number verbal  PLAN OF CARE: Admit to inpatient   PATIENT DISPOSITION:  PACU - hemodynamically stable.   Delay start of Pharmacological VTE agent (>24hrs) due to surgical blood loss or risk of bleeding: not applicable

## 2017-07-21 NOTE — Progress Notes (Signed)
Patient ID: Isabella Bennett, female   DOB: July 01, 1991, 26 y.o.   MRN: [id number]  Report to Dr Palma Holter and advised to go and check pt. Cx is 7/vtx+1 with caput. NO further change since 12:25pm. Pt is aware that LTCS is indicated and Pitocin is not able to get UC's adequate. LTCS called and 2 gms Ancef and Exparel ordered to [place], RN.

## 2017-07-21 NOTE — Progress Notes (Signed)
Isabella Bennett is a 26 y.o. G1P0 at [redacted]w[redacted]d by dating  admitted for postdates iOL.   Subjective: Resting  Objective: BP (!) 100/49   Pulse 76   Temp 98.1 F (36.7 C) (Oral)   Resp 18   Ht 5\' 2"  (1.575 m)   Wt 74.8 kg (165 lb)   LMP 10/04/2016   SpO2 98%   BMI 30.18 kg/m  I/O last 3 completed shifts: In: 1252 [I.V.:1252] Out: -  Total I/O In: -  Out: 350 [Urine:350]  FHT:  UC:   q1-2 mins, MVU's have never been over 150, Will increase PItocin to 30 mun/min SVE:   Dilation: 7 Effacement (%): 90 Station: +1 Exam by:: Ranell Patrick, RN  Labs: Lab Results  Component Value Date   WBC 6.1 07/19/2017   HGB 12.7 07/19/2017   HCT 37.7 07/19/2017   MCV 77.5 (L) 07/19/2017   PLT 191 07/19/2017    Assessment / Plan: A:1. IUP at postdates for IOL at 41 2/7 weeks 2. Prolonged induction process P: GBS neg 2. Continue Pitocin to reach adequate labor PIt at 26 mun/min 3. Will increase PItocin up to 30 mun/min  [name] 07/21/2017, 12:21 PM

## 2017-07-21 NOTE — Anesthesia Procedure Notes (Signed)
Date/Time: 07/21/2017 3:20 PM Performed by: Allean Found Pre-anesthesia Checklist: Patient identified, Emergency Drugs available, Suction available, Patient being monitored and Timeout performed Patient Re-evaluated:Patient Re-evaluated prior to induction Oxygen Delivery Method: Nasal cannula

## 2017-07-22 ENCOUNTER — Encounter: Payer: Self-pay | Admitting: Obstetrics and Gynecology

## 2017-07-22 DIAGNOSIS — D6959 Other secondary thrombocytopenia: Secondary | ICD-10-CM | POA: Diagnosis not present

## 2017-07-22 LAB — CBC
HEMATOCRIT: 29.8 % — AB (ref 35.0–47.0)
HEMATOCRIT: 30.9 % — AB (ref 35.0–47.0)
HEMOGLOBIN: 10.3 g/dL — AB (ref 12.0–16.0)
Hemoglobin: 10.2 g/dL — ABNORMAL LOW (ref 12.0–16.0)
MCH: 26.9 pg (ref 26.0–34.0)
MCH: 26 pg (ref 26.0–34.0)
MCHC: 34.3 g/dL (ref 32.0–36.0)
MCHC: 33.4 g/dL (ref 32.0–36.0)
MCV: 78.3 fL — AB (ref 80.0–100.0)
MCV: 77.9 fL — ABNORMAL LOW (ref 80.0–100.0)
Platelets: 120 10*3/uL — ABNORMAL LOW (ref 150–440)
Platelets: 151 10*3/uL (ref 150–440)
RBC: 3.8 MIL/uL (ref 3.80–5.20)
RBC: 3.97 MIL/uL (ref 3.80–5.20)
RDW: 15.9 % — AB (ref 11.5–14.5)
RDW: 16.4 % — ABNORMAL HIGH (ref 11.5–14.5)
WBC: 6.4 10*3/uL (ref 3.6–11.0)
WBC: 7 10*3/uL (ref 3.6–11.0)

## 2017-07-22 NOTE — Progress Notes (Addendum)
  Subjective:   Doing well.  Complains of gas and no flatus. Tolerating clear PO diet, tolerating pain with PO meds. Denies: CP SOB F/C, N/V, calf pain    Objective:  Blood pressure 100/64, pulse 70, temperature 97.8 F (36.6 C), temperature source Oral, resp. rate 18, height 5\' 2"  (1.575 m), weight 74.8 kg (165 lb), last menstrual period 10/04/2016, SpO2 99 %,   General: NAD Pulmonary: no increased work of breathing Abdomen: non-distended, non-tender, fundus firm at level of umbilicus Incision: bandaged, c/d/i Extremities: no edema, no erythema, no tenderness  Results for orders placed or performed during the hospital encounter of 07/19/17 (from the past 24 hour(s))  CBC     Status: Abnormal   Collection Time: 07/22/17  5:39 AM  Result Value Ref Range   WBC 6.4 3.6 - 11.0 K/uL   RBC 3.80 3.80 - 5.20 MIL/uL   Hemoglobin 10.2 (L) 12.0 - 16.0 g/dL   HCT 29.8 (L) 35.0 - 47.0 %   MCV 78.3 (L) 80.0 - 100.0 fL   MCH 26.9 26.0 - 34.0 pg   MCHC 34.3 32.0 - 36.0 g/dL   RDW 15.9 (H) 11.5 - 14.5 %   Platelets 120 (L) 150 - 440 K/uL    Intake/Output Summary (Last 24 hours) at 07/22/17 0845 Last data filed at 07/22/17 0415  Gross per 24 hour  Intake          2016.13 ml  Output             3640 ml  Net         -1623.87 ml      Assessment:   26 y.o. G1P1001 postoperativeday # 1 LTCS   Plan:  1. Acute blood loss anemia - hemodynamically stable and asymptomatic - po ferrous sulfate  2. Borderline Low UOP:  Await diuresis.  Increase PO hydration, IV Bolus PRN.  Remove foley once adequate UOP has been persistent.  3. Postop: get OOB and moving,  This will help with abdominal gas and flatus.  Advance diet as tolerated.  4. Thrombocytopenia:  Repeat CBC 12 hours from initial draw (17:30)  5. Rh negative:  Baby also Rh negative, Rhogam not indicated   ----- Larey Days, MD Attending Obstetrician and Gynecologist [facility], Department of [place]

## 2017-07-22 NOTE — Op Note (Signed)
NAMECHRISTOPHER, Isabella Bennett                  ACCOUNT NO.:  [id number]  MEDICAL RECORD NO.:  [id number]  LOCATION:                                 FACILITY:  PHYSICIAN:  Laverta Baltimore, MD     DATE OF BIRTH:  DATE OF PROCEDURE:  07/21/2017 DATE OF DISCHARGE:                              OPERATIVE REPORT   PREOPERATIVE DIAGNOSIS: 1. 41+ 3 weeks' estimated gestational age. 2. Active phase arrest. 3. Cephalopelvic disproportion.  POSTOPERATIVE DIAGNOSIS: 1. 41+ 3 weeks' estimated gestational age. 2. Active phase arrest. 3. Cephalopelvic disproportion.  PROCEDURE:  Primary low transverse cesarean section.  ANESTHESIA:  Surgical dosing of continuous lumbar epidural.  FIRST ASSISTANT:  Scrub tech.  INDICATION:  A 26 year old gravida 1, para 0 patient brought in on July 19, 2017 for induction for post dates.  The patient was administered Cytotec, and on induction day #2, Pitocin was administered as well.  Patient did not progress past 7 cm despite Pitocin.  DESCRIPTION OF PROCEDURE:  After adequate surgical dosing of continuous lumbar epidural, the patient was placed in dorsal supine position with hip rolled on the right side.  The patient's abdomen was prepped and draped in normal sterile fashion.  Time-out was performed.  The patient did receive 2 g IV Ancef and 500 mg IV azithromycin prior to commencement of the case.  A Pfannenstiel incision was made 2 fingerbreadths above the symphysis pubis.  Sharp dissection was used to identify the fascia.  Fascia was opened in the midline and opened in a transverse fashion.  The superior aspect of the fascia was grasped with Kocher clamps and the recti muscles were dissected free.  The inferior aspect of fascia was grasped with Kocher clamps and pyramidalis muscles dissected free.  Entry into the peritoneal cavity was accomplished sharply.  The vesicouterine peritoneal fold was identified and opened in a transverse fashion and the  bladder was reflected inferiorly.  A low transverse uterine incision was made upon entry into the endometrial cavity, clear fluid resulted.  An extremely wedged fetal head was noted and was elevated up and out of the pelvis followed by fundal pressure and delivery of the shoulders.  Loose nuchal cord was reduced.  A large vigorous female was dried on the patient's abdomen.  After 60 seconds, cord was clamped and infant was passed.  Vigorous female infant was passed to nursery staff, who assigned Apgar scores of 9 and 10.  Fetal weight 9 pounds and 0 ounces.  IV Pitocin was administered while the uterus was exteriorized and the placenta was removed.  Somewhat atonic uterus required additional 0.2 mg intramuscular methargen.  The uterine incision was closed with 1 chromic suture in a running locking fashion. Good approximation of edges.  Good hemostasis was noted.  Fallopian tubes and ovaries appeared normal.  Posterior cul-de-sac was irrigated and suctioned, and the uterus was placed back in the abdominal cavity. Pericolic gutters were wiped clean with laparotomy tape, and the uterine incision again appeared hemostatic, and Interceed was placed over the incision.  The fascia was then closed with a running 0 Vicryl suture without difficulty.  The fascial plane was then injected with a  solution of 20 mL of 1.3% Exparel (bupivacaine) plus 30 mL of 0.5% Marcaine and 50 mL saline, 60 mL were injected along the fascial incisional line. Subcutaneous tissues were irrigated and bovied for hemostasis and the skin was reapproximated with Insorb absorbable staples, good cosmetic effect.  The patient tolerated the procedure well.  ESTIMATED BLOOD LOSS:  700 mL.  INTRAOPERATIVE FLUIDS:  1150 mL.  The patient was taken to recovery room in good condition.    ______________________________ Laverta Baltimore, MD   ______________________________ Laverta Baltimore, MD    TS/MEDQ  D:   07/21/2017  T:  07/21/2017  Job:  [id number]

## 2017-07-22 NOTE — Anesthesia Postprocedure Evaluation (Signed)
Anesthesia Post Note  Patient: Isabella Bennett  Procedure(s) Performed: Procedure(s): [place]  Patient location during evaluation: Mother Baby Anesthesia Type: Epidural Level of consciousness: awake, awake and alert and oriented Pain management: pain level controlled Vital Signs Assessment: post-procedure vital signs reviewed and stable Respiratory status: spontaneous breathing, nonlabored ventilation and respiratory function stable Cardiovascular status: blood pressure returned to baseline and stable Postop Assessment: no headache and no backache Anesthetic complications: no     Last Vitals:  Vitals:   07/21/17 2322 07/22/17 0341  BP: (!) 98/52 (!) 101/53  Pulse: 72 69  Resp: 18 19  Temp: 36.9 C 36.6 C  SpO2: 98% 96%    Last Pain:  Vitals:   07/22/17 0500  TempSrc:   PainSc: 3                  Proofreader

## 2017-07-23 MED ORDER — INFLUENZA VAC SPLIT QUAD 0.5 ML IM SUSY
0.5000 mL | PREFILLED_SYRINGE | INTRAMUSCULAR | Status: AC
  Administered 2017-07-24: 0.5 mL via INTRAMUSCULAR
  Filled 2017-07-23: qty 0.5

## 2017-07-23 NOTE — Progress Notes (Signed)
Subjective: Postpartum Day 2: Cesarean Delivery Patient reports no problems voiding.    Objective: Vital signs in last 24 hours: Temp:  [98.2 F (36.8 C)-99.5 F (37.5 C)] 98.2 F (36.8 C) (09/20 1552) Pulse Rate:  [84-101] 84 (09/20 0822) Resp:  [18] 18 (09/20 0822) BP: (111-112)/(57-72) 112/72 (09/20 0822) SpO2:  [100 %] 100 % (09/20 9518)  Physical Exam:  General: alert and cooperative Lochia: appropriate Uterine Fundus: firm Incision: healing well, no significant drainage, no dehiscence, no significant erythema DVT Evaluation: No evidence of DVT seen on physical exam.   Recent Labs  07/22/17 0539 07/22/17 1729  HGB 10.2* 10.3*  HCT 29.8* 30.9*    Assessment/Plan: Status post Cesarean section. Doing well postoperatively.  Continue current care. Continue lactation support Acute anemia: PO iron  Benjaman Kindler 07/23/2017, 7:45 PM

## 2017-07-23 NOTE — Lactation Note (Signed)
This note was copied from a baby's chart. Lactation Consultation Note  Patient Name: Isabella Bennett INOMV'E Date: 07/23/2017 Reason for consult: Follow-up assessment   Maternal Data Formula Feeding for Exclusion: No Has patient been taught Hand Expression?: Yes Does the patient have breastfeeding experience prior to this delivery?: No  Feeding Feeding Type: Breast Fed  LATCH Score Latch: Grasps breast easily, tongue down, lips flanged, rhythmical sucking.  Audible Swallowing: Spontaneous and intermittent  Type of Nipple: Everted at rest and after stimulation  Comfort (Breast/Nipple): Filling, red/small blisters or bruises, mild/mod discomfort  Hold (Positioning): Assistance needed to correctly position infant at breast and maintain latch.  LATCH Score: 8  Interventions Interventions: Breast feeding basics reviewed;Assisted with latch;Skin to skin;Support pillows;Position options;Hand express  Lactation Tools Discussed/Used     Consult Status Consult Status: Follow-up Follow-up type: In-patient    Daryel November 07/23/2017, 11:51 AM

## 2017-07-23 NOTE — Lactation Note (Signed)
This note was copied from a baby's chart. Lactation Consultation Note  Patient Name: Isabella Bennett XOVAN'V Date: 07/23/2017 Reason for consult: Follow-up assessment   Maternal Data Formula Feeding for Exclusion: No Has patient been taught Hand Expression?: Yes Does the patient have breastfeeding experience prior to this delivery?: No  Feeding Feeding Type: Breast Fed  LATCH Score Latch: Grasps breast easily, tongue down, lips flanged, rhythmical sucking.  Audible Swallowing: Spontaneous and intermittent  Type of Nipple: Everted at rest and after stimulation  Comfort (Breast/Nipple): Filling, red/small blisters or bruises, mild/mod discomfort  Hold (Positioning): Assistance needed to correctly position infant at breast and maintain latch.  LATCH Score: 8  Interventions Interventions: Breast feeding basics reviewed;Assisted with latch;Skin to skin;Support pillows;Position options;Hand express  We were able to latch the baby to the breast without the nipple shield. Lactation Tools Discussed/Used     Consult Status Consult Status: Follow-up Follow-up type: In-patient    Daryel November 07/23/2017, 11:51 AM

## 2017-07-24 MED ORDER — OXYCODONE-ACETAMINOPHEN 5-325 MG PO TABS: 1.0000 | ORAL_TABLET | ORAL | 0 refills | Status: DC | PRN

## 2017-07-24 NOTE — Discharge Instructions (Signed)

## 2017-07-24 NOTE — Lactation Note (Signed)
This note was copied from a baby's chart. Lactation Consultation Note  Patient Name: Isabella Bennett OTLXB'W Date: 07/24/2017 Reason for consult: Follow-up assessment;Infant weight loss   Maternal Data Formula Feeding for Exclusion: No (supplement per dr's order because of wt loss of 9) Mom pumped breasts after breastfeeding to obtain supplement, obtained sm amt after 15 min, unable to draw up in syringe, mom has a Medela pump and style at home Feeding Feeding Type: Formula Nipple Type: Slow - flow Length of feed: 12 min Baby falls asleep easily at breast LATCH Score Latch: Grasps breast easily, tongue down, lips flanged, rhythmical sucking.  Audible Swallowing: A few with stimulation  Type of Nipple: Everted at rest and after stimulation  Comfort (Breast/Nipple): Filling, red/small blisters or bruises, mild/mod discomfort  Hold (Positioning): Assistance needed to correctly position infant at breast and maintain latch.  LATCH Score: 7  Interventions Interventions: Assisted with latch;Support pillows  Lactation Tools Discussed/Used Tools: Pump;Bottle Pump Review: Setup, frequency, and cleaning;Milk Storage Initiated by:: Chaya Jan RNC IBCLC Date initiated:: 07/24/17 Mom to breastfeed baby every 2-3 hrs and supplement with EBM or 15- 20 cc formula after feeds, pump breasts after feedings and supplement this for formula as more is obtained until baby able to stay awake and nurse at breast and milk production is increased, has wt check on Monday at [facility]. Peds office  Consult Status Consult Status: Complete Date: 07/24/17 Follow-up type: In-patient    Ferol Luz 07/24/2017, 5:08 PM

## 2017-07-24 NOTE — Progress Notes (Signed)
Subjective: Postpartum Day 3/POD#3 Cesarean Delivery Patient reports incisional pain and no problems voiding.    Objective: Vital signs in last 24 hours: Temp:  [98 F (36.7 C)-98.6 F (37 C)] 98.1 F (36.7 C) (09/21 0750) Pulse Rate:  [88-100] 88 (09/21 0750) Resp:  [16-17] 16 (09/21 0750) BP: (107-116)/(59-72) 107/59 (09/21 0750) SpO2:  [100 %] 100 % (09/21 0750)  Physical Exam:  General: alert, cooperative and appears stated age  Heart:S1S2, RRR, No M/R/G Lungs: CTA bilat, no W/R/R Lochia: mod, no clots Uterine Fundus: firm Incision: healing well DVT Evaluation: No evidence of DVT seen on physical exam.   Recent Labs  07/22/17 0539 07/22/17 1729  HGB 10.2* 10.3*  HCT 29.8* 30.9*    Assessment/Plan: Status post Cesarean section. Doing well postoperatively.  Discharge home with standard precautions and return to clinic in 4-6 weeks. FU in 2 weeks pp to see Dr Ouida Sills.   Isabella Bennett 07/24/2017, 8:53 AM

## 2017-07-24 NOTE — Progress Notes (Signed)
Patient discharged home with infant. Discharge instructions, prescriptions and follow up appointment given to and reviewed with patient. Patient verbalized understanding. Patient wheeled out with infant by RN

## 2019-01-01 IMAGING — US US MFM OB DETAIL+14 WK
1 series · 12 of 28 positions shown · non-contrast
Comparison: none

PATIENT INFO:

PERFORMED BY:
SERVICE(S) PROVIDED:
INDICATIONS:
29 weeks gestation of pregnancy
Renal fetal anatomy abnormality
Low placenta
FETAL EVALUATION:
Num Of Fetuses:     1
Fetal Heart         130
Rate(bpm):
Cardiac Activity:   Present
Presentation:       Breech
Placenta:           Posterior
P. Cord Insertion:  Marginal
AFI Sum(cm)     %Tile       Largest Pocket(cm)
12.46           33
RUQ(cm)       RLQ(cm)       LUQ(cm)        LLQ(cm)
4.3
BIOMETRY:
BPD:      74.5  mm     G. Age:  29w 6d         43  %    CI:          75.3  %    70 - 86
FL/HC:       18.6  %    19.2 -
HC:      272.3  mm     G. Age:  29w 5d         18  %    HC/AC:       1.09       0.99 -
AC:      250.5  mm     G. Age:  29w 2d         31  %    FL/BPD:      68.1  %    71 - 87
FL:       50.7  mm     G. Age:  27w 1d        < 3  %    FL/AC:       20.2  %    20 - 24
HUM:      48.3  mm     G. Age:  28w 2d         23  %
CER:      37.1  mm     G. Age:  32w 0d         80  %
CM:        7.5  mm
Est. FW:    0533   gm   2 lb 13 oz      12  %
GESTATIONAL AGE:
LMP:           29w 5d        Date:  10/04/16                 EDD:   07/11/17
U/S Today:     29w 0d                                        EDD:   07/16/17
Best:          29w 5d     Det. By:  LMP  (10/04/16)          EDD:   07/11/17
ANATOMY:
Cranium:               Within Normal Limits   Aortic Arch:            Normal appearance
Cavum:                 CSP visualized         Ductal Arch:            Normal appearance
Ventricles:            Normal appearance      Diaphragm:              Within Normal Limits
Choroid Plexus:        Within Normal Limits   Stomach:                Seen
Cerebellum:            Within Normal Limits   Abdomen:                Within Normal
Limits
Posterior Fossa:       Within Normal Limits   Abdominal Wall:         Normal appearance
Nuchal Fold:           Beyond 22 weeks        Cord Vessels:           3 vessels
gestation
Face:                  Orbits visualized      Kidneys:                Normal appearance
Lips:                  Normal appearance      Bladder:                Seen
Thoracic:              Within Normal Limits   Spine:                  Lumbar/sacral
spine not well seen
Heart:                 4-Chamber view         Upper Extremities:      Visualized
appears normal
RVOT:                  Normal appearance      Lower Extremities:      Visualized
LVOT:                  Normal appearance
Other:  Pericardial effusion measures 2.8 mm
CERVIX UTERUS ADNEXA:
Cervix
Length:           3.83  cm.

[Series 1: us mfm ob detail+14 wk · 0.20mm/px · 12 of 83 slices shown]
[im 4/83]
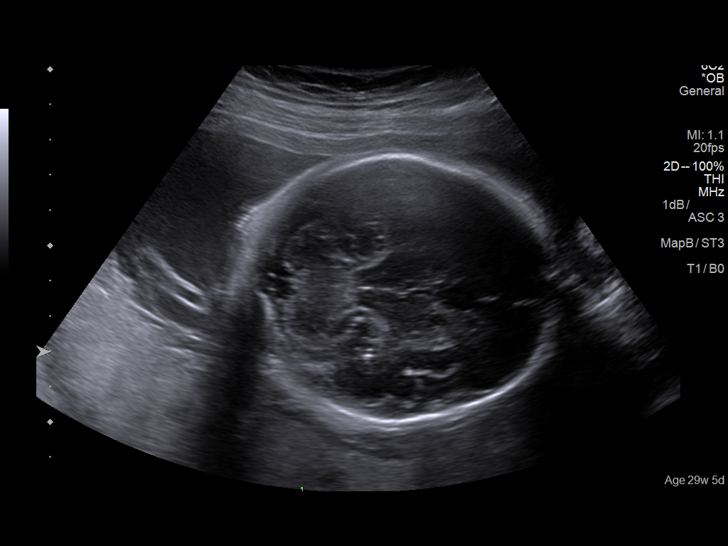
[im 10/83]
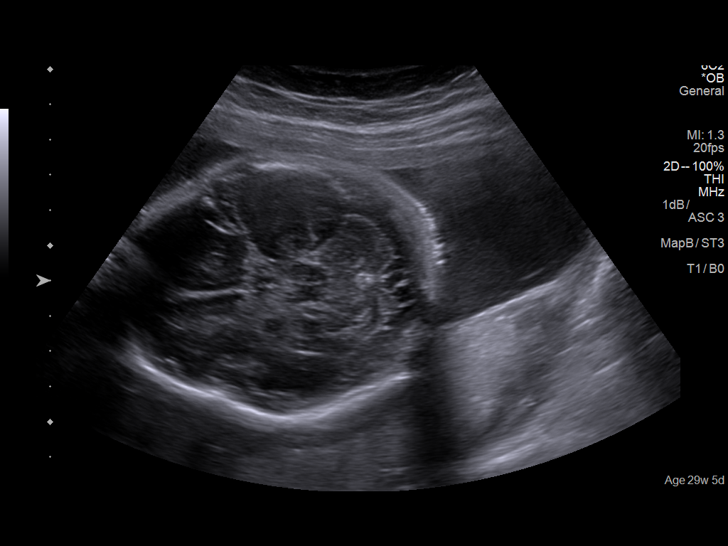
[im 16/83]
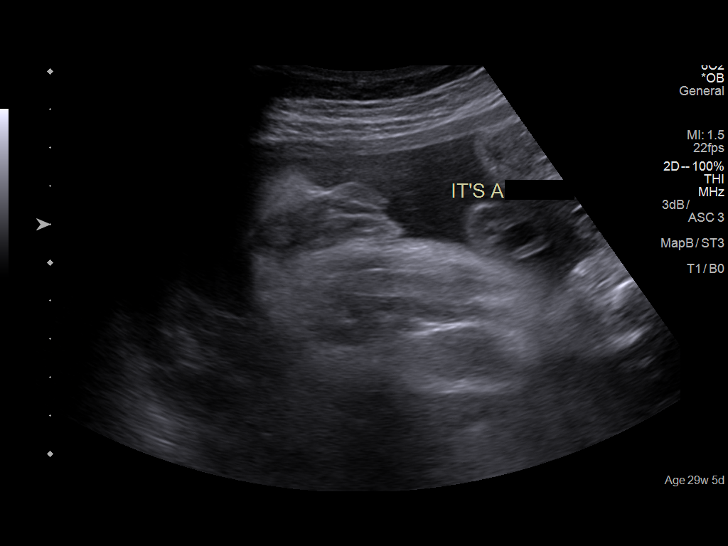
[im 25/83]
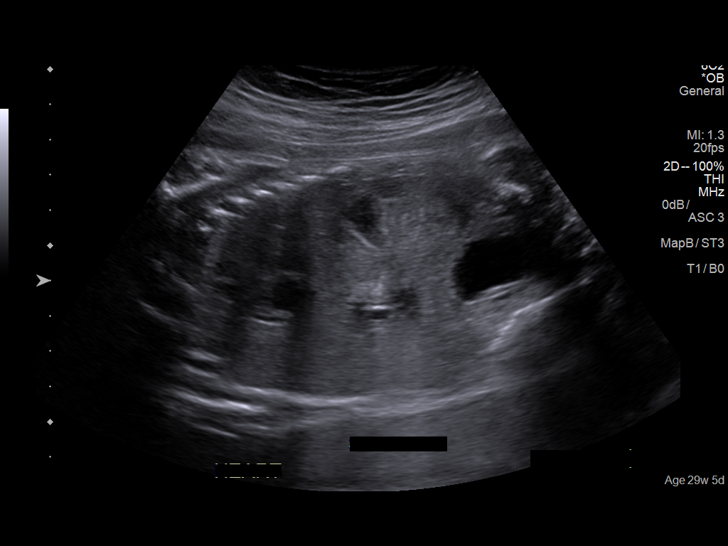
[im 31/83]
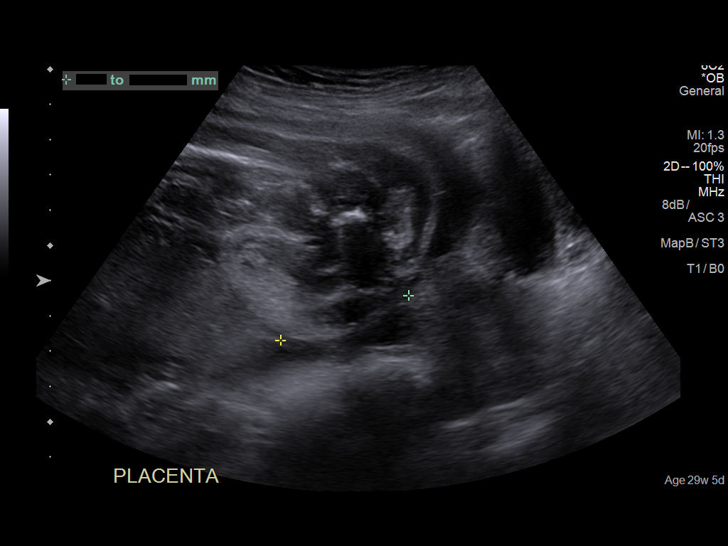
[im 37/83]
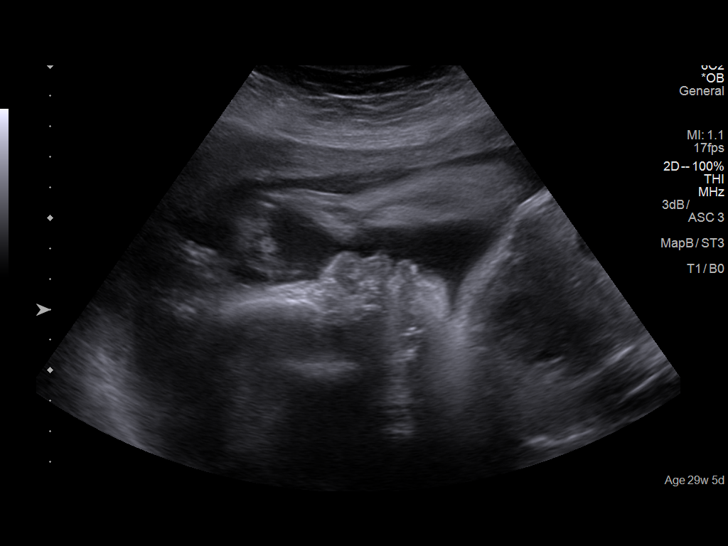
[im 46/83]
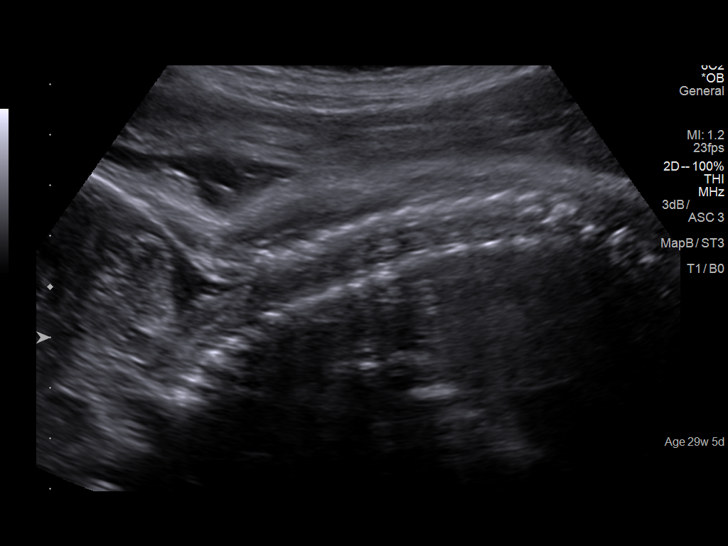
[im 52/83]
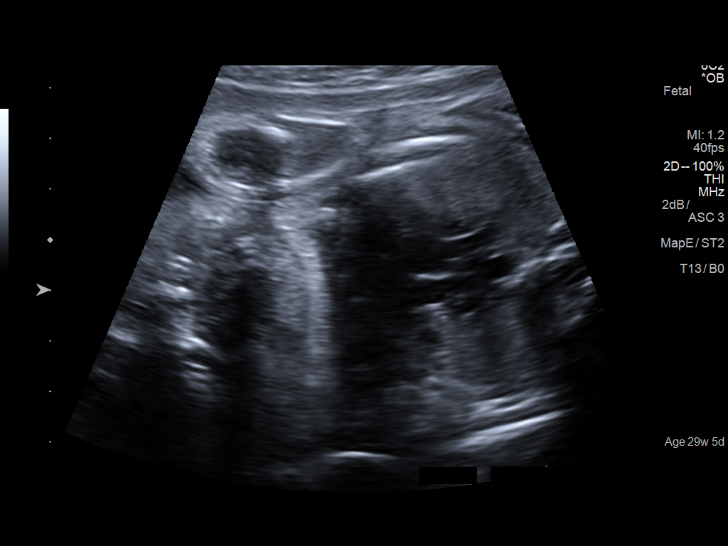
[im 58/83]
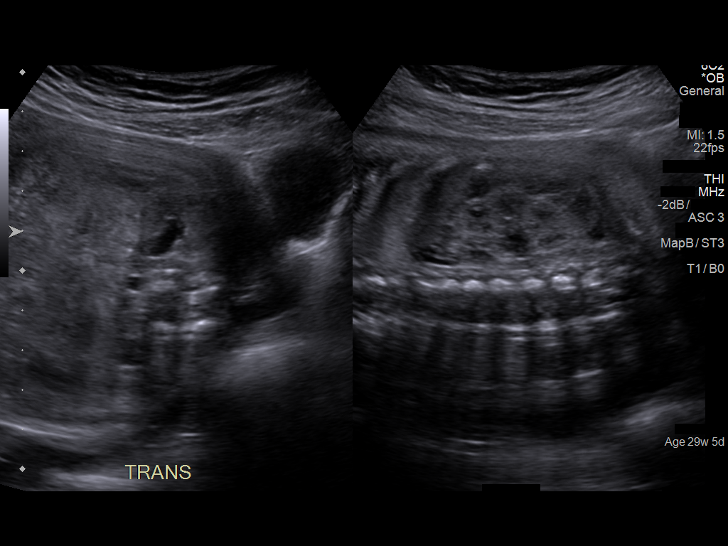
[im 67/83]
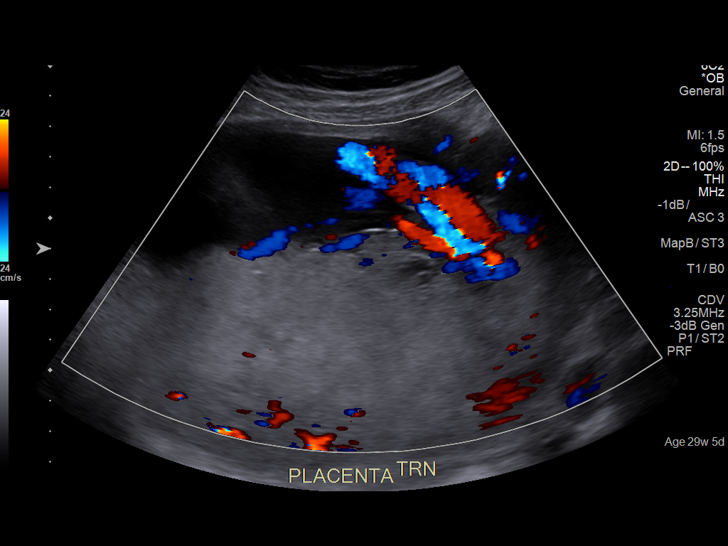
[im 73/83]
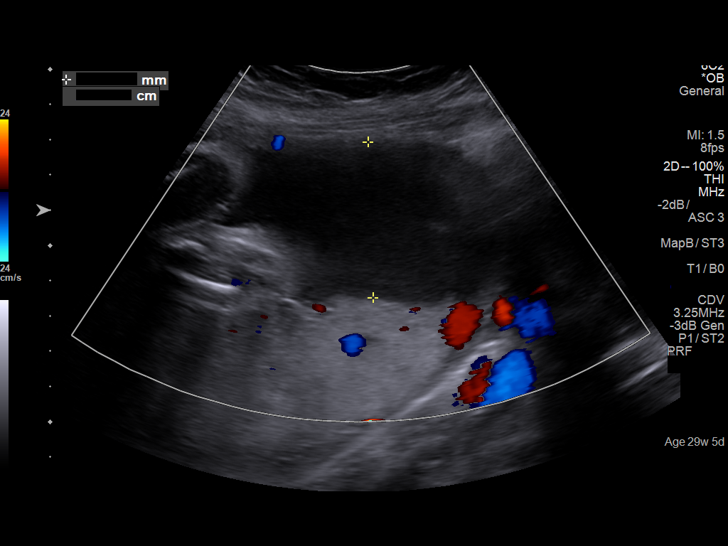
[im 79/83]
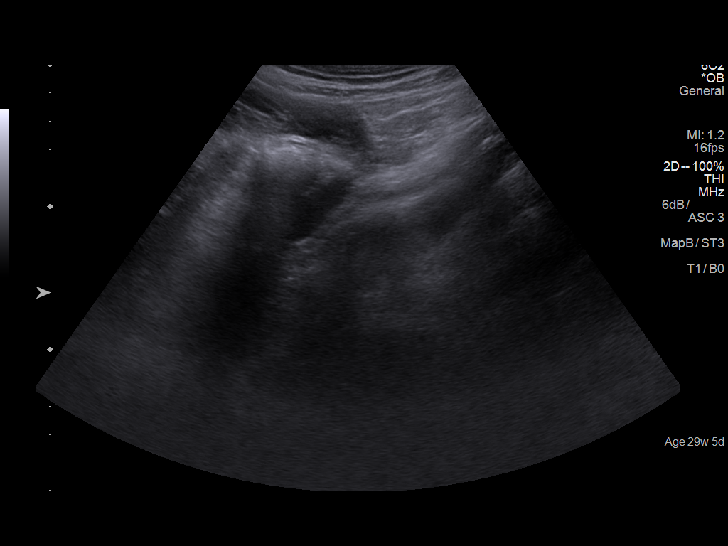

[12 of 28 positions shown; findings below may reference images not displayed]

IMPRESSION: Dear Ms  Dimatsa,

Thank you for referring your patient for a fetal anatomy and
growth evaluation.

There is a singleton gestation with normal amniotic fluid
volume.

The fetal biometry correlates with established dating.
Pregnancy is at 29 w 5d - dated by LMP of 10/04/2016
consistent with u/s done at [REDACTED] on 02/18/17
measuring at 18w 5d .
Adequate interval growth noted.
The estimated fetal weight is at the 12   percentile. Ologo. (
shorter femur length brings down EFW )
Fetal anatomy appears normal - fetal renal pelves measure
right 3.4 mm, left 3.9 mm
Measurements <4mm are considered within normal limits at
this gestational age .
Incidental note of  physiologic pericardial effusion measuring
2.8 mm

Recommend follow-up scan for fetal growth at 34-36 weeks
We are happy to re-assess here if needed.

Thank you for allowing us to participate in your patient's care.
assistance.

## 2020-02-02 ENCOUNTER — Other Ambulatory Visit: Payer: Self-pay

## 2020-02-02 DIAGNOSIS — L700 Acne vulgaris: Secondary | ICD-10-CM

## 2020-02-02 MED ORDER — CLINDAMYCIN PHOS-BENZOYL PEROX 1-5 % EX GEL: Freq: Two times a day (BID) | CUTANEOUS | 2 refills | Status: DC

## 2020-02-23 ENCOUNTER — Ambulatory Visit: Payer: Self-pay | Attending: Internal Medicine

## 2020-02-23 DIAGNOSIS — Z20822 Contact with and (suspected) exposure to covid-19: Secondary | ICD-10-CM | POA: Insufficient documentation

## 2020-02-25 ENCOUNTER — Telehealth: Payer: Self-pay

## 2020-02-25 NOTE — Telephone Encounter (Signed)
Pt  called for covid results -advised results are not back.  

## 2020-02-26 ENCOUNTER — Telehealth: Payer: Self-pay

## 2020-02-26 NOTE — Telephone Encounter (Signed)
Patient called for results. Was advised the results are not back.

## 2020-02-26 NOTE — Telephone Encounter (Signed)
Received call from patient checking Covid results.  Advised no results at this time.   

## 2020-02-27 LAB — SARS-COV-2, NAA 2 DAY TAT

## 2020-02-27 LAB — NOVEL CORONAVIRUS, NAA: SARS-CoV-2, NAA: NOT DETECTED

## 2020-03-07 ENCOUNTER — Other Ambulatory Visit: Payer: Self-pay

## 2020-03-07 ENCOUNTER — Ambulatory Visit (INDEPENDENT_AMBULATORY_CARE_PROVIDER_SITE_OTHER): Payer: Managed Care, Other (non HMO) | Admitting: Dermatology

## 2020-03-07 DIAGNOSIS — L700 Acne vulgaris: Secondary | ICD-10-CM | POA: Diagnosis not present

## 2020-03-07 DIAGNOSIS — L219 Seborrheic dermatitis, unspecified: Secondary | ICD-10-CM | POA: Diagnosis not present

## 2020-03-07 DIAGNOSIS — L819 Disorder of pigmentation, unspecified: Secondary | ICD-10-CM

## 2020-03-07 DIAGNOSIS — D229 Melanocytic nevi, unspecified: Secondary | ICD-10-CM

## 2020-03-07 DIAGNOSIS — L650 Telogen effluvium: Secondary | ICD-10-CM | POA: Diagnosis not present

## 2020-03-07 DIAGNOSIS — F411 Generalized anxiety disorder: Secondary | ICD-10-CM | POA: Insufficient documentation

## 2020-03-07 MED ORDER — CLINDAMYCIN PHOS-BENZOYL PEROX 1-5 % EX GEL: CUTANEOUS | 6 refills | Status: DC

## 2020-03-07 MED ORDER — ADAPALENE 0.3 % EX GEL: CUTANEOUS | 6 refills | Status: DC

## 2020-03-07 MED ORDER — KETOCONAZOLE 2 % EX SHAM: MEDICATED_SHAMPOO | CUTANEOUS | 6 refills | Status: DC

## 2020-03-07 NOTE — Progress Notes (Signed)
   Follow-Up Visit   Subjective  Isabella Bennett is a 29 y.o. female who presents for the following: Acne (patient experiencing dry eyelids when using Benzaclin QHS, she is using Differin 0.3% gel QAM, and Doxycycline 50mg  po QD PRN flares.) and seb derm with hair loss of the scalp (patient has been under stress lately and started an anti-anxiety medication. She is currently using Ketoconazole 2% shampoo, Biotin, and Rogaine.).  The following portions of the chart were reviewed this encounter and updated as appropriate:  Tobacco  Allergies  Meds  Problems  Med Hx  Surg Hx  Fam Hx     Review of Systems:  No other skin or systemic complaints except as noted in HPI or Assessment and Plan.  Objective  Well appearing patient in no apparent distress; mood and affect are within normal limits.  A focused examination was performed including the face and scalp. Relevant physical exam findings are noted in the Assessment and Plan.  Objective  Face: 3 pink macules on the face with some hyperpigmentation and minimal scarring.  Objective  Scalp: Pink patches with greasy scale.   Objective  Scalp: Diffuse thinning of hair, positive hair pull test.   Assessment & Plan  Acne vulgaris with dyschromia Face  Continue Adapalene 0.3% gel QHS, Benzaclin gel QAM, and Doxycycline 50mg  po QD (may increase to 2 tabs during flares).  Dark areas will fade with time and treatment.  Reordered Medications clindamycin-benzoyl peroxide (BENZACLIN) gel  Seborrheic dermatitis Scalp improving Continue Ketoconazole 2% shampoo QOD   Telogen effluvium Scalp Flared again - Likely related to stress - patient recently started anti anxiety about a month ago.  Continue Biotin daily, recommend LLL caps, and topical Rogaine. Discussed PRP injections but not recommended at this time.    Melanocytic Nevi  - L chest irritated appearing, discussed shave removal if bothersome - Tan-brown and/or pink-flesh-colored  symmetric macules and papules - Benign appearing on exam today - Observation - Call clinic for new or changing moles - Recommend daily use of broad spectrum spf 30+ sunscreen to sun-exposed areas.   Return in about 6 months (around 09/07/2020).  Luther Redo, CMA, am acting as scribe for Sarina Ser, MD .  Documentation: I have reviewed the above documentation for accuracy and completeness, and I agree with the above.  Sarina Ser, MD

## 2020-03-07 NOTE — Progress Notes (Deleted)
   Follow-Up Visit   Subjective  Isabella Bennett is a 29 y.o. female who presents for the following: No chief complaint on file..  The following portions of the chart were reviewed this encounter and updated as appropriate:     Review of Systems:  No other skin or systemic complaints except as noted in HPI or Assessment and Plan.  Objective  Well appearing patient in no apparent distress; mood and affect are within normal limits.  A focused examination was performed including the face and scalp. Relevant physical exam findings are noted in the Assessment and Plan.  No skin findings found.   Assessment & Plan   No follow-ups on file.

## 2020-03-10 ENCOUNTER — Encounter: Payer: Self-pay | Admitting: Dermatology

## 2020-04-23 ENCOUNTER — Other Ambulatory Visit: Payer: Self-pay

## 2020-04-23 MED ORDER — DOXYCYCLINE HYCLATE 50 MG PO TABS: 1.0000 | ORAL_TABLET | Freq: Every day | ORAL | 5 refills | Status: DC

## 2020-04-23 NOTE — Progress Notes (Signed)
Fax came in to [facility] Doxycycline. RX e-scriped to [facility].

## 2020-06-19 ENCOUNTER — Other Ambulatory Visit: Payer: Self-pay

## 2020-06-19 ENCOUNTER — Ambulatory Visit (INDEPENDENT_AMBULATORY_CARE_PROVIDER_SITE_OTHER): Payer: Managed Care, Other (non HMO) | Admitting: Dermatology

## 2020-06-19 ENCOUNTER — Encounter: Payer: Self-pay | Admitting: Dermatology

## 2020-06-19 DIAGNOSIS — L219 Seborrheic dermatitis, unspecified: Secondary | ICD-10-CM

## 2020-06-19 DIAGNOSIS — L650 Telogen effluvium: Secondary | ICD-10-CM

## 2020-06-19 DIAGNOSIS — L700 Acne vulgaris: Secondary | ICD-10-CM

## 2020-06-19 DIAGNOSIS — R21 Rash and other nonspecific skin eruption: Secondary | ICD-10-CM

## 2020-06-19 DIAGNOSIS — L819 Disorder of pigmentation, unspecified: Secondary | ICD-10-CM

## 2020-06-19 MED ORDER — SPIRONOLACTONE 25 MG PO TABS: 25.0000 mg | ORAL_TABLET | Freq: Every day | ORAL | 1 refills | Status: DC

## 2020-06-19 MED ORDER — MOMETASONE FUROATE 0.1 % EX SOLN: CUTANEOUS | 1 refills | Status: DC

## 2020-06-19 NOTE — Progress Notes (Signed)
Follow-Up Visit   Subjective  Isabella Bennett is a 29 y.o. female who presents for the following: Follow-up (Patient here today for acne, alopecia and seb derm follow up. ) and Rash (Patient thinks she had a bug bite on her abdomen about 1 1/2 months ago. It was itchy so she used rx HC (maybe 2%) and now has discoloration at the area applied. ).  For acne, patient using adapalene 0.3% gel qhs, benzaclin am and taking doxycycline 50mg  1-2 times daily as needed. Acne has improved.  She is using ketoconazole 2% shampoo for seb derm 1-3 times weekly with some improvement. Patient still having hair loss. She uses Rogaine but not every day due to disliking how her hair feels after application of foam.  The following portions of the chart were reviewed this encounter and updated as appropriate:  Tobacco  Allergies  Meds  Problems  Med Hx  Surg Hx  Fam Hx      Review of Systems:  No other skin or systemic complaints except as noted in HPI or Assessment and Plan.  Objective  Well appearing patient in no apparent distress; mood and affect are within normal limits.  A focused examination was performed including face, neck, chest, abdomen, back, scalp and axillae. Relevant physical exam findings are noted in the Assessment and Plan.  Objective  Scalp: Mild diffuse thinning with some regrowth  Objective  Scalp: Scale at scalp  Objective  Face: Scattered inflammatory papules bilateral cheeks with trace open comedones.   Blood pressure 99/75  Objective  axillae: Hyperpigmented patches  Objective  Abdomen: Well demarcated hyperpigmented patch with linear demarcation   Assessment & Plan  Telogen effluvium Scalp  Cont Rogaine daily switching to solution so she can use it more consistently (foam leaves hair feeling greasy). Also discussed red light cap.  Seborrheic dermatitis Scalp  Chronic.  Patient with significant pruritus.    Start mometasone solution once daily to scalp  as needed for itch.  Cont ketoconazole 2% shampoo 1-3 times weekly, leaving in for 10 minutes before rinsing out.  Topical steroids (such as triamcinolone, fluocinolone, fluocinonide, mometasone, clobetasol, halobetasol, betamethasone, hydrocortisone) can cause thinning and lightening of the skin if they are used for too long in the same area. Your physician has selected the right strength medicine for your problem and area affected on the body. Please use your medication only as directed by your physician to prevent side effects.    Ordered Medications: mometasone (ELOCON) 0.1 % lotion  Acne vulgaris Face  Chronic.  Patient not on birth control, not planning on becoming pregnant until next year.   Having to stop doxycycline for two weeks prior to laser hair removal and flaring significantly at that time. Also having to take doxycycline 50 mg twice daily somewhat often due to flares.   Will start spironolactone 25 mg QHS  (starting low dose due to blood pressure today. She reports it is usually normal. She fasted yesterday). Monitor for lightheadedness. Cont adapalene 0.3% gel QHS Cont BenzaClin QAM  Cont doxycycline 50mg  1 po qd with food, will consider decreasing to 20mg  twice a day if we can achieve adequate control adding the spironolactone  Doxycycline should be taken with food to prevent nausea. Do not lay down for 30 minutes after taking. Be cautious with sun exposure and use good sun protection while on this medication. Pregnant women should not take this medication.   Spironolactone can cause increased urination and cause blood pressure to decrease. Please  watch for signs of lightheadedness and be cautious when changing position. It can sometimes cause breast tenderness or an irregular period in premenopausal women. It can also increase potassium. The increase in potassium usually is not a concern unless you are taking other medicines that also increase potassium, so please be sure  your doctor knows all of the other medications you are taking. This medication should not be taken  by pregnant women.  Topical retinoid medications like tretinoin/Retin-A, adapalene/Differin, tazarotene/Fabior, and Epiduo/Epiduo Forte can cause dryness and irritation when first started. Only apply a pea-sized amount to the entire affected area. Avoid applying it around the eyes, edges of mouth and creases at the nose. If you experience irritation, use a good moisturizer first and/or apply the medicine less often. If you are doing well with the medicine, you can increase how often you use it until you are applying every night. Be careful with sun protection while using this medication as it can make you sensitive to the sun. This medicine should not be used by pregnant women.      clindamycin-benzoyl peroxide (BENZACLIN) gel - Face  Ordered Medications: spironolactone (ALDACTONE) 25 MG tablet  Rash axillae  Favor Acanthosis Nigracans vs PIPA  Patient has been checked for diabetes in the past when pregnant observe  Post-inflammatory pigmentary changes Abdomen  Patient has The Perfect Bleaching Cream at home. Can apply this twice a day up to 3 months at a time.   Patient may call in for Skin Medicinals Lightening cream (hydroquinone 12%/kojic acid 6%/vitamin C). Advised that she can only use hydroquinone for 3 months and that both of these contain hydroquinone. If she would like to switch the skin medicinal's when she runs out of the perfect bleaching cream she should only use both of them for a total of 3 months to avoid the risk of exogenous ochronosis  Return for 4-6 weeks, Acne.   Graciella Belton, RMA, am acting as scribe for Forest Gleason, MD .  Documentation: I have reviewed the above documentation for accuracy and completeness, and I agree with the above.  Forest Gleason, MD

## 2020-06-19 NOTE — Patient Instructions (Addendum)
  Spironolactone can cause increased urination and cause blood pressure to decrease. Please watch for signs of lightheadedness and be cautious when changing position. It can sometimes cause breast tenderness or an irregular period in premenopausal women. It can also increase potassium. The increase in potassium usually is not a concern unless you are taking other medicines that also increase potassium, so please be sure your doctor knows all of the other medications you are taking. This medication should not be taken  by pregnant women.  Doxycycline should be taken with food to prevent nausea. Do not lay down for 30 minutes after taking. Be cautious with sun exposure and use good sun protection while on this medication. Pregnant women should not take this medication.   Start mometasone solution once daily to scalp as needed for itch.  Cont ketoconazole 2% shampoo 1-3 times weekly, leaving in for 10 minutes before rinsing out.  Topical steroids (such as triamcinolone, fluocinolone, fluocinonide, mometasone, clobetasol, halobetasol, betamethasone, hydrocortisone) can cause thinning and lightening of the skin if they are used for too long in the same area. Your physician has selected the right strength medicine for your problem and area affected on the body. Please use your medication only as directed by your physician to prevent side effects.

## 2020-08-01 ENCOUNTER — Ambulatory Visit: Payer: Managed Care, Other (non HMO) | Admitting: Dermatology

## 2020-08-09 ENCOUNTER — Ambulatory Visit (INDEPENDENT_AMBULATORY_CARE_PROVIDER_SITE_OTHER): Payer: Managed Care, Other (non HMO) | Admitting: Dermatology

## 2020-08-09 ENCOUNTER — Other Ambulatory Visit: Payer: Self-pay

## 2020-08-09 DIAGNOSIS — L810 Postinflammatory hyperpigmentation: Secondary | ICD-10-CM | POA: Diagnosis not present

## 2020-08-09 DIAGNOSIS — L219 Seborrheic dermatitis, unspecified: Secondary | ICD-10-CM

## 2020-08-09 DIAGNOSIS — R21 Rash and other nonspecific skin eruption: Secondary | ICD-10-CM

## 2020-08-09 DIAGNOSIS — L700 Acne vulgaris: Secondary | ICD-10-CM

## 2020-08-09 MED ORDER — DOXYCYCLINE MONOHYDRATE 100 MG PO CAPS: 100.0000 mg | ORAL_CAPSULE | Freq: Two times a day (BID) | ORAL | 0 refills | Status: DC

## 2020-08-09 MED ORDER — SPIRONOLACTONE 50 MG PO TABS: 50.0000 mg | ORAL_TABLET | Freq: Every day | ORAL | 2 refills | Status: DC

## 2020-08-09 NOTE — Patient Instructions (Addendum)
Doxycycline should be taken with food to prevent nausea. Do not lay down for 30 minutes after taking. Be cautious with sun exposure and use good sun protection while on this medication. Pregnant women should not take this medication.   Topical retinoid medications like tretinoin/Retin-A, adapalene/Differin, tazarotene/Fabior, and Epiduo/Epiduo Forte can cause dryness and irritation when first started. Only apply a pea-sized amount to the entire affected area. Avoid applying it around the eyes, edges of mouth and creases at the nose. If you experience irritation, use a good moisturizer first and/or apply the medicine less often. If you are doing well with the medicine, you can increase how often you use it until you are applying every night. Be careful with sun protection while using this medication as it can make you sensitive to the sun. This medicine should not be used by pregnant women.   Continue doxycycline increasing temporarily to 100mg  twice daily with food for 2 weeks then decrease back to 50mg  daily. Continue BenzaClin once daily. Continue adapalene 0.3% gel once daily.  May start The Perfect Bleaching Cream twice daily for up to 3 months. Once you run out of it we can send in SkinMedicinals lightening cream.  Spironolactone can cause increased urination and cause blood pressure to decrease. Please watch for signs of lightheadedness and be cautious when changing position. It can sometimes cause breast tenderness or an irregular period in premenopausal women. It can also increase potassium. The increase in potassium usually is not a concern unless you are taking other medicines that also increase potassium, so please be sure your doctor knows all of the other medications you are taking. This medication should not be taken  by pregnant women.

## 2020-08-09 NOTE — Progress Notes (Signed)
   Follow-Up Visit   Subjective  Isabella Bennett is a 29 y.o. female who presents for the following: Follow-up.  Patient here today for 6 week follow up. Patient using BenzaClin, adapalene 0.3% gel and taking doxycycline 50mg  daily for acne but advises it has gotten worse. She did not start the spironolactone.  Also was seen for seborrheic dermatitis at the scalp. Patient advises that is about the same, using ketoconazole 2% shampoo 2-3 times weekly, not using mometasone solution.  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 Abdomen has improved but axillae is the same.   The following portions of the chart were reviewed this encounter and updated as appropriate:  Tobacco  Allergies  Meds  Problems  Med Hx  Surg Hx  Fam Hx      Review of Systems:  No other skin or systemic complaints except as noted in HPI or Assessment and Plan.  Objective  Well appearing patient in no apparent distress; mood and affect are within normal limits.  A focused examination was performed including face, neck, chest and back. Relevant physical exam findings are noted in the Assessment and Plan.  Objective  Face: Few inflammatory papules at the face, rare open comedone Few small inflammatory papules, trace open comedones at chest Trace open comedones at back  Objective  Axillae: Hyperpigmented patches  Objective  Head - Anterior (Face): Hyperpigmented macules  Objective  Mid Parietal Scalp: Pink patches with greasy scale.    Assessment & Plan  Acne vulgaris Face  Chronic, not at goal  Continue doxycycline increasing temporarily to 100mg  twice daily with food for 2 weeks then decrease back to 50mg  daily. Patient has wedding next week.  Continue BenzaClin in the morning. Continue adapalene 0.3% gel at bedtime. May start spironolactone 50mg  once daily after wedding.   BP  132/79    clindamycin-benzoyl peroxide (BENZACLIN) gel - Face  doxycycline (MONODOX) 100 MG capsule - Face  spironolactone (ALDACTONE) 50 MG tablet - Face  Rash Axillae  Favor Acanthosis Nigracans. Blood sugars normal during pregnancy.  Restart The Perfect Bleaching Cream daily for up to 3 months. Call if she develops irritation.  Postinflammatory hyperpigmentation Head - Anterior (Face)  Secondary to acne  May start The Perfect Bleaching Cream twice daily for up to 3 months. Once she runs out of it we will send in SkinMedicinals lightening cream.  Recommend daily broad spectrum sunscreen SPF 30+ to sun-exposed areas, reapply every 2 hours as needed.   Seborrheic dermatitis Mid Parietal Scalp  Continue ketoconazole shampoo  Start mometasone solution daily as needed.  Avoid applying to face, groin, and axilla. Use as directed. Risk of skin atrophy with long-term use reviewed.    Other Related Medications mometasone (ELOCON) 0.1 % lotion  Return for 6-8 weeks for acne, 2 week blood pressure check with nurse.  Graciella Belton, RMA, am acting as scribe for Forest Gleason, MD .  Documentation: I have reviewed the above documentation for accuracy and completeness, and I agree with the above.  Forest Gleason, MD

## 2020-08-28 ENCOUNTER — Encounter: Payer: Self-pay | Admitting: Dermatology

## 2020-09-03 ENCOUNTER — Ambulatory Visit: Payer: Managed Care, Other (non HMO)

## 2020-09-17 ENCOUNTER — Ambulatory Visit: Payer: Managed Care, Other (non HMO) | Admitting: Dermatology

## 2021-09-30 ENCOUNTER — Other Ambulatory Visit: Payer: Self-pay

## 2021-09-30 ENCOUNTER — Emergency Department
Admission: EM | Admit: 2021-09-30 | Discharge: 2021-09-30 | Disposition: A | Discharge status: Home / Self Care | Payer: BLUE CROSS/BLUE SHIELD | Attending: Emergency Medicine | Admitting: Emergency Medicine

## 2021-09-30 DIAGNOSIS — B309 Viral conjunctivitis, unspecified: Secondary | ICD-10-CM | POA: Diagnosis not present

## 2021-09-30 DIAGNOSIS — R509 Fever, unspecified: Secondary | ICD-10-CM | POA: Diagnosis present

## 2021-09-30 DIAGNOSIS — Z20822 Contact with and (suspected) exposure to covid-19: Secondary | ICD-10-CM | POA: Diagnosis not present

## 2021-09-30 DIAGNOSIS — Z79899 Other long term (current) drug therapy: Secondary | ICD-10-CM | POA: Insufficient documentation

## 2021-09-30 DIAGNOSIS — B349 Viral infection, unspecified: Secondary | ICD-10-CM

## 2021-09-30 LAB — RESP PANEL BY RT-PCR (FLU A&B, COVID) ARPGX2
Influenza A by PCR: NEGATIVE
Influenza B by PCR: NEGATIVE
SARS Coronavirus 2 by RT PCR: NEGATIVE

## 2021-09-30 MED ORDER — SULFACETAMIDE SODIUM 10 % OP SOLN: 2.0000 [drp] | Freq: Four times a day (QID) | OPHTHALMIC | 0 refills | Status: AC

## 2021-09-30 MED ORDER — DEXAMETHASONE SODIUM PHOSPHATE 10 MG/ML IJ SOLN
10.0000 mg | Freq: Once | INTRAMUSCULAR | Status: AC
  Administered 2021-09-30: 11:00:00 10 mg via INTRAMUSCULAR
  Filled 2021-09-30: qty 1

## 2021-09-30 NOTE — ED Provider Notes (Signed)
[facility] Emergency Department Provider Note ____________________________________________   Event Date/Time   First MD Initiated Contact with Patient 09/30/21 [phone]     (approximate)  I have reviewed the triage vital signs and the nursing notes.   HISTORY  Chief Complaint Fever  HPI Isabella Bennett is a 30 y.o. female presents to the emergency department for treatment and evaluation of cough, fever, eye drainage, sore throat that has been present for the past week.  Family also with similar symptoms.  She has been taking azithromycin for sore throat and ibuprofen for fever but nothing seems to be helping.  She states that her eyes were matted shut this morning upon awakening.     Past Medical History:  Diagnosis Date   Medical history non-contributory     Patient Active Problem List   Diagnosis Date Noted   Thrombocytopenia due to blood loss 07/22/2017   Postoperative state 07/21/2017   Indication for care or intervention related to labor and delivery 07/19/2017    Past Surgical History:  Procedure Laterality Date   CESAREAN SECTION  07/21/2017   Procedure: CESAREAN SECTION;  Surgeon: Ouida Sills, Gwen Her, MD;  Location: [facility] ORS;  Service: Obstetrics;;    Prior to Admission medications   Medication Sig Start Date End Date Taking? Authorizing Provider  sulfacetamide (BLEPH-10) 10 % ophthalmic solution Place 2 drops into both eyes 4 (four) times daily for 7 days. 09/30/21 10/07/21 Yes Tkeyah Burkman B, FNP  Adapalene 0.3 % gel Apply a pea sized amount to the entire face QHS 03/07/20   Ralene Bathe, MD  clindamycin-benzoyl peroxide [facility]) gel Apply a thin coat to the entire QAM 03/07/20   Ralene Bathe, MD  cyanocobalamin (,VITAMIN B-12,) 1000 MCG/ML injection Inject into the muscle.    [provider]  doxycycline (MONODOX) 100 MG capsule Take 1 capsule (100 mg total) by mouth 2 (two) times daily. With food 08/09/20   Moye, [place],  MD  Doxycycline Hyclate 50 MG TABS Take 1 tablet by mouth daily. 04/23/20   Ralene Bathe, MD  ketoconazole (NIZORAL) 2 % shampoo Shampoo into scalp let sit 5-10 minutes then wash out. Use 3d/wk. 03/07/20   Ralene Bathe, MD  mometasone (ELOCON) 0.1 % lotion Apply once daily to affected areas at scalp as needed for itch. 06/19/20   Moye, [place], MD  oxyCODONE-acetaminophen (PERCOCET/ROXICET) 5-325 MG tablet Take 1 tablet by mouth every 4 (four) hours as needed (pain scale 4-7). Patient not taking: Reported on 03/07/2020 07/24/17   [name], CNM  Prenatal Vit-Fe Fumarate-FA (PRENATAL MULTIVITAMIN) TABS tablet Take 1 tablet by mouth daily at 12 noon.    [provider]  spironolactone (ALDACTONE) 50 MG tablet Take 1 tablet (50 mg total) by mouth daily. 08/09/20   Moye, [place], MD    Allergies Patient has no known allergies.  No family history on file.  Social History Social History   Tobacco Use   Smoking status: Never   Smokeless tobacco: Never  Substance Use Topics   Alcohol use: No   Drug use: No    Review of Systems  Constitutional: Positive fever/chills Eyes: No visual changes.  Positive for drainage ENT: Positive sore throat. Cardiovascular: Denies chest pain. Respiratory: Denies shortness of breath.  Positive for cough Gastrointestinal: No abdominal pain.  No nausea, no vomiting.  No diarrhea.  No constipation. Genitourinary: Negative for dysuria. Musculoskeletal: Negative for back pain. Skin: Negative for rash. Neurological: Positive for headaches, focal weakness  or numbness  ____________________________________________   PHYSICAL EXAM:  VITAL SIGNS: ED Triage Vitals  Enc Vitals Group     BP 09/30/21 0750 106/69     Pulse Rate 09/30/21 0750 98     Resp 09/30/21 0750 16     Temp 09/30/21 0750 99.6 F (37.6 C)     Temp Source 09/30/21 0750 Oral     SpO2 09/30/21 0750 97 %     Weight --      Height --      Head Circumference --      Peak  Flow --      Pain Score 09/30/21 0751 5     Pain Loc --      Pain Edu? --      Excl. in [place]? --     Constitutional: Alert and oriented.  Acutely ill appearing and in no acute distress. Eyes: Conjunctivae are erythematous and injected with clear drainage. Head: Atraumatic. Nose:  congestion/rhinnorhea present. Mouth/Throat: Mucous membranes are moist.  Oropharynx erythematous. Neck: No stridor.   Hematological/Lymphatic/Immunilogical: No cervical lymphadenopathy. Cardiovascular: Normal rate, regular rhythm. Grossly normal heart sounds.  Good peripheral circulation. Respiratory: Normal respiratory effort.  No retractions. Lungs CTAB. Gastrointestinal: Soft and nontender. No distention. No abdominal bruits. Genitourinary:  Musculoskeletal: No lower extremity tenderness nor edema.  No joint effusions. Neurologic:  Normal speech and language. No gross focal neurologic deficits are appreciated. No gait instability. Skin:  Skin is warm, dry and intact. No rash noted. Psychiatric: Mood and affect are normal. Speech and behavior are normal.  ____________________________________________   LABS (all labs ordered are listed, but only abnormal results are displayed)  Labs Reviewed  RESP PANEL BY RT-PCR (FLU A&B, COVID) [id number]   ____________________________________________  EKG  Not indicated ____________________________________________  RADIOLOGY  ED MD interpretation:    Not indicated I, Sherrie George, personally viewed and evaluated these images (plain radiographs) as part of my medical decision making, as well as reviewing the written report by the radiologist.  Official radiology report(s): No results found.  ____________________________________________   PROCEDURES  Procedure(s) performed (including Critical Care):  Procedures  ____________________________________________   INITIAL IMPRESSION / ASSESSMENT AND PLAN     30 year old female presenting to the emergency  department for treatment and evaluation of viral symptoms as described in the HPI.  Symptoms have been ongoing for the past 6 days.  While awaiting ER room assignment, COVID and influenza tests were completed and are all negative.  Plan will be to give her a dose of Decadron and prescribe Bleph-10 eyedrops.  She is to continue taking her Tylenol or ibuprofen for body aches, headache, fever.  For symptoms that are not improving over the week or for symptoms of change or worsen she is to either see primary care or return to the emergency department.    As part of my medical decision making, I reviewed the following data within the [place]   ___________________________________________   FINAL CLINICAL IMPRESSION(S) / ED DIAGNOSES  Final diagnoses:  Viral illness  Acute viral conjunctivitis of both eyes     ED Discharge Orders          Ordered    sulfacetamide (BLEPH-10) 10 % ophthalmic solution  4 times daily        09/30/21 1104             Sherria Brodersen was evaluated in Emergency Department on 09/30/2021 for the symptoms described in the history of present illness. She was  evaluated in the context of the global COVID-19 pandemic, which necessitated consideration that the patient might be at risk for infection with the SARS-CoV-2 virus that causes COVID-19. Institutional protocols and algorithms that pertain to the evaluation of patients at risk for COVID-19 are in a state of rapid change based on information released by regulatory bodies including the CDC and federal and state organizations. These policies and algorithms were followed during the patient's care in the ED.   Note:  This document was prepared using Dragon voice recognition software and may include unintentional dictation errors.    Victorino Dike, FNP 09/30/21 1106    Rada Hay, MD 09/30/21 [phone]

## 2021-09-30 NOTE — ED Notes (Signed)
Pt to ER c/o cough, generalized body aches, HA, fever, since 5 days. Pt states when she coughs in morning, sputum is "red colored" but then changes to white.  States had diarrhea for 2 days until yesterday.  States that 2 days ago she "blacked out" when getting up to urinate. She was able to lower herself to ground and did not hit her head. States that vision was "all black". She rested on ground for a few minutes then felt better.

## 2021-09-30 NOTE — ED Triage Notes (Signed)
Pt come with c/o fever and cough since last week.

## 2021-09-30 NOTE — Discharge Instructions (Signed)
Please continue taking your Tylenol and ibuprofen for headache, body aches, sore throat, fever.  Use the eyedrops as directed.  Follow-up with primary care or return to the emergency department for symptoms of change or worsen.

## 2021-10-21 ENCOUNTER — Other Ambulatory Visit: Payer: Self-pay

## 2021-10-21 ENCOUNTER — Observation Stay
Admission: EM | Admit: 2021-10-21 | Discharge: 2021-10-22 | Disposition: A | Discharge status: Home / Self Care | Payer: BLUE CROSS/BLUE SHIELD | Attending: Internal Medicine | Admitting: Internal Medicine

## 2021-10-21 DIAGNOSIS — E039 Hypothyroidism, unspecified: Secondary | ICD-10-CM | POA: Insufficient documentation

## 2021-10-21 DIAGNOSIS — F419 Anxiety disorder, unspecified: Secondary | ICD-10-CM

## 2021-10-21 DIAGNOSIS — T43202A Poisoning by unspecified antidepressants, intentional self-harm, initial encounter: Secondary | ICD-10-CM

## 2021-10-21 DIAGNOSIS — Z20822 Contact with and (suspected) exposure to covid-19: Secondary | ICD-10-CM | POA: Diagnosis not present

## 2021-10-21 DIAGNOSIS — T431X2A Poisoning by monoamine-oxidase-inhibitor antidepressants, intentional self-harm, initial encounter: Secondary | ICD-10-CM | POA: Diagnosis present

## 2021-10-21 DIAGNOSIS — D6959 Other secondary thrombocytopenia: Secondary | ICD-10-CM

## 2021-10-21 DIAGNOSIS — Z79899 Other long term (current) drug therapy: Secondary | ICD-10-CM | POA: Diagnosis not present

## 2021-10-21 DIAGNOSIS — Z046 Encounter for general psychiatric examination, requested by authority: Secondary | ICD-10-CM

## 2021-10-21 DIAGNOSIS — T50902A Poisoning by unspecified drugs, medicaments and biological substances, intentional self-harm, initial encounter: Secondary | ICD-10-CM

## 2021-10-21 DIAGNOSIS — F329 Major depressive disorder, single episode, unspecified: Secondary | ICD-10-CM | POA: Diagnosis not present

## 2021-10-21 DIAGNOSIS — Y900 Blood alcohol level of less than 20 mg/100 ml: Secondary | ICD-10-CM | POA: Insufficient documentation

## 2021-10-21 DIAGNOSIS — F332 Major depressive disorder, recurrent severe without psychotic features: Secondary | ICD-10-CM

## 2021-10-21 DIAGNOSIS — F32A Depression, unspecified: Secondary | ICD-10-CM | POA: Diagnosis not present

## 2021-10-21 DIAGNOSIS — T43201A Poisoning by unspecified antidepressants, accidental (unintentional), initial encounter: Secondary | ICD-10-CM | POA: Diagnosis present

## 2021-10-21 LAB — RESP PANEL BY RT-PCR (FLU A&B, COVID) ARPGX2
Influenza A by PCR: NEGATIVE
Influenza B by PCR: NEGATIVE
SARS Coronavirus 2 by RT PCR: NEGATIVE

## 2021-10-21 LAB — COMPREHENSIVE METABOLIC PANEL
ALT: 11 U/L (ref 0–44)
AST: 21 U/L (ref 15–41)
Albumin: 3.7 g/dL (ref 3.5–5.0)
Alkaline Phosphatase: 40 U/L (ref 38–126)
Anion gap: 7 (ref 5–15)
BUN: 6 mg/dL (ref 6–20)
CO2: 23 mmol/L (ref 22–32)
Calcium: 8.8 mg/dL — ABNORMAL LOW (ref 8.9–10.3)
Chloride: 112 mmol/L — ABNORMAL HIGH (ref 98–111)
Creatinine, Ser: 0.51 mg/dL (ref 0.44–1.00)
GFR, Estimated: >60 mL/min (ref >60)
Glucose, Bld: 87 mg/dL (ref 70–99)
Potassium: 3.8 mmol/L (ref 3.5–5.1)
Sodium: 142 mmol/L (ref 135–145)
Total Bilirubin: 0.5 mg/dL (ref 0.3–1.2)
Total Protein: 7.3 g/dL (ref 6.5–8.1)

## 2021-10-21 LAB — ACETAMINOPHEN LEVEL: Acetaminophen (Tylenol), Serum: <10 ug/mL — ABNORMAL LOW (ref 10–30)

## 2021-10-21 LAB — SALICYLATE LEVEL: Salicylate Lvl: <7 mg/dL — ABNORMAL LOW (ref 7.0–30.0)

## 2021-10-21 LAB — CBC WITH DIFFERENTIAL/PLATELET
Abs Immature Granulocytes: 0.02 10*3/uL (ref 0.00–0.07)
Basophils Absolute: 0 10*3/uL (ref 0.0–0.1)
Basophils Relative: 0 %
Eosinophils Absolute: 0 10*3/uL (ref 0.0–0.5)
Eosinophils Relative: 1 %
HCT: 37.2 % (ref 36.0–46.0)
Hemoglobin: 11.9 g/dL — ABNORMAL LOW (ref 12.0–15.0)
Immature Granulocytes: 0 %
Lymphocytes Relative: 24 %
Lymphs Abs: 1.3 10*3/uL (ref 0.7–4.0)
MCH: 25.1 pg — ABNORMAL LOW (ref 26.0–34.0)
MCHC: 32 g/dL (ref 30.0–36.0)
MCV: 78.3 fL — ABNORMAL LOW (ref 80.0–100.0)
Monocytes Absolute: 0.2 10*3/uL (ref 0.1–1.0)
Monocytes Relative: 4 %
Neutro Abs: 3.8 10*3/uL (ref 1.7–7.7)
Neutrophils Relative %: 71 %
Platelets: 222 10*3/uL (ref 150–400)
RBC: 4.75 MIL/uL (ref 3.87–5.11)
RDW: 14.8 % (ref 11.5–15.5)
WBC: 5.4 10*3/uL (ref 4.0–10.5)
nRBC: 0 % (ref 0.0–0.2)

## 2021-10-21 LAB — URINE DRUG SCREEN, QUALITATIVE (ARMC ONLY)
Amphetamines, Ur Screen: NOT DETECTED
Barbiturates, Ur Screen: NOT DETECTED
Benzodiazepine, Ur Scrn: NOT DETECTED
Cannabinoid 50 Ng, Ur ~~LOC~~: NOT DETECTED
Cocaine Metabolite,Ur ~~LOC~~: NOT DETECTED
MDMA (Ecstasy)Ur Screen: NOT DETECTED
Methadone Scn, Ur: NOT DETECTED
Opiate, Ur Screen: NOT DETECTED
Phencyclidine (PCP) Ur S: NOT DETECTED
Tricyclic, Ur Screen: NOT DETECTED

## 2021-10-21 LAB — POC URINE PREG, ED: Preg Test, Ur: NEGATIVE

## 2021-10-21 LAB — ETHANOL: Alcohol, Ethyl (B): <10 mg/dL (ref <10)

## 2021-10-21 MED ORDER — MAGNESIUM HYDROXIDE 400 MG/5ML PO SUSP: 30.0000 mL | Freq: Every day | ORAL | Status: DC | PRN

## 2021-10-21 MED ORDER — TRAZODONE HCL 50 MG PO TABS: 25.0000 mg | ORAL_TABLET | Freq: Every evening | ORAL | Status: DC | PRN

## 2021-10-21 MED ORDER — ENOXAPARIN SODIUM 40 MG/0.4ML IJ SOSY
40.0000 mg | PREFILLED_SYRINGE | INTRAMUSCULAR | Status: DC
  Administered 2021-10-22: 40 mg via SUBCUTANEOUS
  Filled 2021-10-21: qty 0.4

## 2021-10-21 MED ORDER — CHARCOAL ACTIVATED PO LIQD
50.0000 g | Freq: Once | ORAL | Status: AC
  Administered 2021-10-21: 18:00:00 50 g via ORAL
  Filled 2021-10-21: qty 240

## 2021-10-21 MED ORDER — ACETAMINOPHEN 325 MG PO TABS: 650.0000 mg | ORAL_TABLET | Freq: Four times a day (QID) | ORAL | Status: DC | PRN

## 2021-10-21 MED ORDER — PRENATAL MULTIVITAMIN CH
1.0000 | ORAL_TABLET | Freq: Every day | ORAL | Status: DC
  Administered 2021-10-22: 12:00:00 1 via ORAL
  Filled 2021-10-21: qty 1

## 2021-10-21 MED ORDER — ONDANSETRON HCL 4 MG PO TABS: 4.0000 mg | ORAL_TABLET | Freq: Four times a day (QID) | ORAL | Status: DC | PRN

## 2021-10-21 MED ORDER — ONDANSETRON HCL 4 MG/2ML IJ SOLN: 4.0000 mg | Freq: Four times a day (QID) | INTRAMUSCULAR | Status: DC | PRN

## 2021-10-21 MED ORDER — ACETAMINOPHEN 325 MG RE SUPP: 650.0000 mg | Freq: Four times a day (QID) | RECTAL | Status: DC | PRN

## 2021-10-21 MED ORDER — POTASSIUM CHLORIDE IN NACL 20-0.9 MEQ/L-% IV SOLN
INTRAVENOUS | Status: DC
  Filled 2021-10-21 (×2): qty 1000

## 2021-10-21 NOTE — H&P (Signed)
[place]   PATIENT NAME: Isabella Bennett    MR#:  [id number]  DATE OF BIRTH:  December 25, 1990  DATE OF ADMISSION:  10/21/2021  PRIMARY CARE PHYSICIAN: Gladstone Lighter, MD   Patient is coming from: Home  REQUESTING/REFERRING PHYSICIAN: Del[place] Kitten, MD  CHIEF COMPLAINT:  Effexor overdose  HISTORY OF PRESENT ILLNESS:  Yerania Hammersmith is a 30 y.o. female with medical history significant for depression and generalized anxiety disorder as well as hypothyroidism during pregnancy, who presented to the ER with acute onset of Effexor overdose.  The patient apparently has a an argument earlier today with her husband and took 6 tablets of Effexor Exar 75 mg when she felt anxious and depressed around 4 PM.  No other medications or substance taking.  She admits to suicidal thoughts and attempt with her action.  She was much [place] in the ER and not feeling suicidal.  No chest pain or palpitations.  No headache or dizziness or blurred vision.  No nausea or vomiting or abdominal pain.  No dysuria, oliguria or hematuria or flank pain.  ED Course: When she came to the ER vital signs were within normal.  Labs reviewed unremarkable CMP and CBC showed hemoglobin of 11.9 with microcytosis.  Tylenol level was less than 10 and salicylate less than 7.  Urine pregnancy test was negative.  Influenza antigens and COVID-19 PCR came back negative.  Urine drug screen came back negative. EKG as reviewed by me : EKG showed sinus bradycardia with rate of 56 with PACs.  Portion control contacted and recommended to activated charcoal and observation for 16 to 18 hours..  The patient received 50 g of charcoal.  She will be admitted to an observation medical telemetry bed for further evaluation and monitoring.  PAST MEDICAL HISTORY:  Depression. Generalized anxiety disorder Hypothyroidism during pregnancy PAST SURGICAL HISTORY:   Past Surgical History:  Procedure Laterality Date   CESAREAN SECTION  07/21/2017    Procedure: CESAREAN SECTION;  Surgeon: Schermerhorn, Gwen Her, MD;  Location: [facility] ORS;  Service: Obstetrics;;    SOCIAL HISTORY:   Social History   Tobacco Use   Smoking status: Never   Smokeless tobacco: Never  Substance Use Topics   Alcohol use: No    FAMILY HISTORY:  History reviewed. No pertinent family history.  She denies any familial diseases.  DRUG ALLERGIES:  No Known Allergies  REVIEW OF SYSTEMS:   ROS As per history of present illness. All pertinent systems were reviewed above. Constitutional, HEENT, cardiovascular, respiratory, GI, GU, musculoskeletal, neuro, psychiatric, endocrine, integumentary and hematologic systems were reviewed and are otherwise negative/unremarkable except for positive findings mentioned above in the HPI.   MEDICATIONS AT HOME:   Prior to Admission medications   Medication Sig Start Date End Date Taking? Authorizing Provider  Adapalene 0.3 % gel Apply a pea sized amount to the entire face QHS 03/07/20   Ralene Bathe, MD  clindamycin-benzoyl peroxide [facility]) gel Apply a thin coat to the entire QAM 03/07/20   Ralene Bathe, MD  cyanocobalamin (,VITAMIN B-12,) 1000 MCG/ML injection Inject into the muscle.    [provider]  doxycycline (MONODOX) 100 MG capsule Take 1 capsule (100 mg total) by mouth 2 (two) times daily. With food 08/09/20   Moye, [place], MD  Doxycycline Hyclate 50 MG TABS Take 1 tablet by mouth daily. 04/23/20   Ralene Bathe, MD  ketoconazole (NIZORAL) 2 % shampoo Shampoo into scalp let sit 5-10 minutes then wash  out. Use 3d/wk. 03/07/20   Ralene Bathe, MD  mometasone (ELOCON) 0.1 % lotion Apply once daily to affected areas at scalp as needed for itch. 06/19/20   Moye, [place], MD  oxyCODONE-acetaminophen (PERCOCET/ROXICET) 5-325 MG tablet Take 1 tablet by mouth every 4 (four) hours as needed (pain scale 4-7). Patient not taking: Reported on 03/07/2020 07/24/17   [name], CNM  Prenatal Vit-Fe  Fumarate-FA (PRENATAL MULTIVITAMIN) TABS tablet Take 1 tablet by mouth daily at 12 noon.    [provider]  spironolactone (ALDACTONE) 50 MG tablet Take 1 tablet (50 mg total) by mouth daily. 08/09/20   Moye, [place], MD      VITAL SIGNS:  Blood pressure 115/74, pulse 60, temperature 98.4 F (36.9 C), temperature source Oral, resp. rate 16, height 5\' 2"  (1.575 m), weight 68 kg, last menstrual period 10/21/2021, SpO2 98 %, unknown if currently breastfeeding.  PHYSICAL EXAMINATION:  Physical Exam  GENERAL:  30 y.o.-year-old female patient lying in the bed with no acute distress.  EYES: Pupils equal, round, reactive to light and accommodation. No scleral icterus. Extraocular muscles intact.  HEENT: Head atraumatic, normocephalic. Oropharynx and nasopharynx clear.  NECK:  Supple, no jugular venous distention. No thyroid enlargement, no tenderness.  LUNGS: Normal breath sounds bilaterally, no wheezing, rales,rhonchi or crepitation. No use of accessory muscles of respiration.  CARDIOVASCULAR: Regular rate and rhythm, S1, S2 normal. No murmurs, rubs, or gallops.  ABDOMEN: Soft, nondistended, nontender. Bowel sounds present. No organomegaly or mass.  EXTREMITIES: No pedal edema, cyanosis, or clubbing.  NEUROLOGIC: Cranial nerves II through XII are intact. Muscle strength 5/5 in all extremities. Sensation intact. Gait not checked.  PSYCHIATRIC: The patient is alert and oriented x 3.  Normal affect and good eye contact. SKIN: No obvious rash, lesion, or ulcer.   LABORATORY PANEL:   CBC Recent Labs  Lab 10/21/21 1836  WBC 5.4  HGB 11.9*  HCT 37.2  PLT 222   ------------------------------------------------------------------------------------------------------------------  Chemistries  Recent Labs  Lab 10/21/21 1836  NA 142  K 3.8  CL 112*  CO2 23  GLUCOSE 87  BUN 6  CREATININE 0.51  CALCIUM 8.8*  AST 21  ALT 11  ALKPHOS 40  BILITOT 0.5    ------------------------------------------------------------------------------------------------------------------  Cardiac Enzymes No results for input(s): TROPONINI in the last 168 hours. ------------------------------------------------------------------------------------------------------------------  RADIOLOGY:  No results found.    IMPRESSION AND PLAN:  Principal Problem:   Antidepressant overdose  1.  Suicidal Effexor XR overdose with history of depression and anxiety. - The patient will be admitted to a medical telemetry observation bed. - She has received 250 g of p.o. activated charcoal. - Psychiatry consult will be obtained. - I notified Dr. Dwyane Dee about the patient. - Poison control was contacted and recommended 16 to 18 hours of observation, in addition to activated charcoal.   DVT prophylaxis: Lovenox.  Code Status: full code.  Family Communication:  The plan of care was discussed in details with the patient (and family). I answered all questions. The patient agreed to proceed with the above mentioned plan. Further management will depend upon hospital course. Disposition Plan: Back to previous home environment Consults called: Psychiatry. All the records are reviewed and case discussed with ED provider.  Status is: Observation    Dispo: The patient is from: Home              Anticipated d/c is to: Home  Patient currently is not medically stable to d/c.              Difficult to place patient: No     Christel Mormon M.D on 10/21/2021 at 8:50 PM  [facility]   From 7 PM-7 AM, contact night-coverage [email or web address]  CC: Primary care physician; Gladstone Lighter, MD

## 2021-10-21 NOTE — ED Notes (Signed)
IVC, pend psych consult

## 2021-10-21 NOTE — ED Triage Notes (Signed)
Pt took 6 venlafaxine hydrochloride 75 mg extended release capsules because she was having a argument with her husband and is depressed. Pt states, "I just wanted my family to see I could actually do it"

## 2021-10-21 NOTE — ED Notes (Signed)
Pt changed into scrubs and socks. Pt belongings taken to car by husband. Faith tech in room.

## 2021-10-21 NOTE — ED Provider Notes (Signed)
[facility] Emergency Department Provider Note   ____________________________________________   Event Date/Time   First MD Initiated Contact with Patient 10/21/21 1712     (approximate)  I have reviewed the triage vital signs and the nursing notes.   HISTORY  Chief Complaint Overdose   HPI Isabella Bennett is a 30 y.o. female at about 4 PM took 6 tablets of Effexor 75 mg  Patient reports that she has been struggling with depression had not taken this medication in a long time for several months, but was feeling anxious and upset and depressed today and decided to take 6 tablets.  She reaffirms that this occurred at 4 PM.  She denies taking any other medication or substance and overdose.  She did have thoughts or inclination of suicidal intent with this, but reports right now she feels [place] and does not feel suicidal at this moment  Has a history of depression  No headache no nausea no vomiting no weakness she denies feeling any symptoms from the ingestion.  Denies pregnancy.  Denies anyone is trying to harm her and she feels like she is in a safe environment Past Medical History:  Diagnosis Date   Medical history non-contributory     Patient Active Problem List   Diagnosis Date Noted   Thrombocytopenia due to blood loss 07/22/2017   Postoperative state 07/21/2017   Indication for care or intervention related to labor and delivery 07/19/2017    Past Surgical History:  Procedure Laterality Date   CESAREAN SECTION  07/21/2017   Procedure: CESAREAN SECTION;  Surgeon: Ouida Sills, Gwen Her, MD;  Location: [facility] ORS;  Service: Obstetrics;;    Prior to Admission medications   Medication Sig Start Date End Date Taking? Authorizing Provider  Adapalene 0.3 % gel Apply a pea sized amount to the entire face QHS 03/07/20   Ralene Bathe, MD  clindamycin-benzoyl peroxide [facility]) gel Apply a thin coat to the entire QAM 03/07/20   Ralene Bathe, MD   cyanocobalamin (,VITAMIN B-12,) 1000 MCG/ML injection Inject into the muscle.    [provider]  doxycycline (MONODOX) 100 MG capsule Take 1 capsule (100 mg total) by mouth 2 (two) times daily. With food 08/09/20   Moye, [place], MD  Doxycycline Hyclate 50 MG TABS Take 1 tablet by mouth daily. 04/23/20   Ralene Bathe, MD  ketoconazole (NIZORAL) 2 % shampoo Shampoo into scalp let sit 5-10 minutes then wash out. Use 3d/wk. 03/07/20   Ralene Bathe, MD  mometasone (ELOCON) 0.1 % lotion Apply once daily to affected areas at scalp as needed for itch. 06/19/20   Moye, [place], MD  oxyCODONE-acetaminophen (PERCOCET/ROXICET) 5-325 MG tablet Take 1 tablet by mouth every 4 (four) hours as needed (pain scale 4-7). Patient not taking: Reported on 03/07/2020 07/24/17   [name], CNM  Prenatal Vit-Fe Fumarate-FA (PRENATAL MULTIVITAMIN) TABS tablet Take 1 tablet by mouth daily at 12 noon.    [provider]  spironolactone (ALDACTONE) 50 MG tablet Take 1 tablet (50 mg total) by mouth daily. 08/09/20   Moye, [place], MD    Allergies Patient has no known allergies.  History reviewed. No pertinent family history.  Social History Social History   Tobacco Use   Smoking status: Never   Smokeless tobacco: Never  Substance Use Topics   Alcohol use: No   Drug use: No    Review of Systems Constitutional: No fever/chills though about 2 weeks ago she did around Thanksgiving time  have some sort of a cold-like illness that has resolved Eyes: No visual changes. ENT: No sore throat. Cardiovascular: Denies chest pain. Respiratory: Denies shortness of breath. Gastrointestinal: No abdominal pain.   Genitourinary: Negative for dysuria.  Denies pregnancy Musculoskeletal: Negative for aches Skin: Negative for rash. Neurological: Negative for headaches, areas of focal weakness or numbness.    ____________________________________________   PHYSICAL EXAM:  VITAL SIGNS: ED Triage  Vitals  Enc Vitals Group     BP 10/21/21 1716 114/78     Pulse Rate 10/21/21 1716 68     Resp 10/21/21 1730 14     Temp 10/21/21 1716 98.4 F (36.9 C)     Temp Source 10/21/21 1716 Oral     SpO2 10/21/21 1714 100 %     Weight 10/21/21 1717 150 lb (68 kg)     Height 10/21/21 1717 5\' 2"  (1.575 m)     Head Circumference --      Peak Flow --      Pain Score 10/21/21 1717 0     Pain Loc --      Pain Edu? --      Excl. in [place]? --     Constitutional: Alert and oriented. Well appearing and in no acute distress. Eyes: Conjunctivae are normal. Head: Atraumatic.  No ataxia or nystagmus. Nose: No congestion/rhinnorhea. Mouth/Throat: Mucous membranes are moist. Neck: No stridor.  Cardiovascular: Normal rate, regular rhythm. Grossly normal heart sounds.  Good peripheral circulation. Respiratory: Normal respiratory effort.  No retractions. Lungs CTAB. Gastrointestinal: Soft and nontender. No distention. Musculoskeletal: No lower extremity tenderness nor edema.  Normal reflexes lower extremities bilateral.  No mild clonus. Neurologic:  Normal speech and language. No gross focal neurologic deficits are appreciated.  Skin:  Skin is warm, dry and intact. No rash noted. Psychiatric: Mood and affect are normal. Speech and behavior are normal.  ____________________________________________   LABS (all labs ordered are listed, but only abnormal results are displayed)  Labs Reviewed  COMPREHENSIVE METABOLIC PANEL - Abnormal; Notable for the following components:      Result Value   Chloride 112 (*)    Calcium 8.8 (*)    All other components within normal limits  CBC WITH DIFFERENTIAL/PLATELET - Abnormal; Notable for the following components:   Hemoglobin 11.9 (*)    MCV 78.3 (*)    MCH 25.1 (*)    All other components within normal limits  ACETAMINOPHEN LEVEL - Abnormal; Notable for the following components:   Acetaminophen (Tylenol), Serum <10 (*)    All other components within normal  limits  SALICYLATE LEVEL - Abnormal; Notable for the following components:   Salicylate Lvl <1.8 (*)    All other components within normal limits  RESP PANEL BY RT-PCR (FLU A&B, COVID) ARPGX2  URINE DRUG SCREEN, QUALITATIVE ([facility] ONLY)  ETHANOL  POC URINE PREG, ED   ____________________________________________  EKG  Reviewed and try me at 7025 Heart rate 69 QRS 89 QTc 400 Normal sinus rhythm, no evidence of acute ischemia.  In particular no acute electrocardiographic abnormalities such as widening of the QRS complex or terminal R wave elevation ____________________________________________  RADIOLOGY  No results found.  ____________________________________________   PROCEDURES  Procedure(s) performed: None  Procedures  Critical Care performed: No  ____________________________________________   INITIAL IMPRESSION / ASSESSMENT AND PLAN / ED COURSE  Pertinent labs & imaging results that were available during my care of the patient were reviewed by me and considered in my medical decision making (see chart  for details).   Acute overdose patient reports clear ingestion time point about 4 PM taking Effexor 6 tablets.  Suicidal attempt.  Psychiatry consult and IVC requested.  Poison control rougher recommending 4-hour levels which I will order, monitoring and observation for at least a approximately 16 to 24-hour period.  Additionally they recommend that the patient is an appropriate window for activated charcoal given the Effexor overdose which we will administer.  Thankfully at this point patient showing no outward physical signs of toxicity.  We will continue to monitor closely on telemetry monitoring     Patient compliant with care requests.  Understanding agreeable with plan for admission and observation.  Labs reviewed, negative acetaminophen and Tylenol levels.  No emergent lab abnormalities noted  ----------------------------------------- 8:42 PM on  10/21/2021 ----------------------------------------- Patient is fully alert oriented without distress.  Advises that she is quite hungry, at this point given fully alert hemodynamically stable no nausea no vomiting no alteration of mental status I think it would be appropriate for her to take by mouth.  Also been a few hours since she took charcoal  Patient understand agreeable with plan.  Because of her prolonged recommended observation.  We will admit her to the hospitalist.  I discussed the case with Dr. Normand Sloop and he is also aware of her being under IVC ____________________________________________   FINAL CLINICAL IMPRESSION(S) / ED DIAGNOSES  Final diagnoses:  None  Overdose, intentional Venlafaxine overdose      Note:  This document was prepared using Dragon voice recognition software and may include unintentional dictation errors       Delman Kitten, MD 10/21/21 2043

## 2021-10-22 ENCOUNTER — Encounter: Payer: Self-pay | Admitting: Psychiatry

## 2021-10-22 ENCOUNTER — Inpatient Hospital Stay
Admission: AD | Admit: 2021-10-22 | Discharge: 2021-10-25 | DRG: 885 | Disposition: A | Discharge status: Home / Self Care | Payer: BLUE CROSS/BLUE SHIELD | Source: Intra-hospital | Attending: Psychiatry | Admitting: Psychiatry

## 2021-10-22 DIAGNOSIS — F332 Major depressive disorder, recurrent severe without psychotic features: Secondary | ICD-10-CM | POA: Diagnosis present

## 2021-10-22 DIAGNOSIS — T43202A Poisoning by unspecified antidepressants, intentional self-harm, initial encounter: Secondary | ICD-10-CM | POA: Diagnosis not present

## 2021-10-22 DIAGNOSIS — F410 Panic disorder [episodic paroxysmal anxiety] without agoraphobia: Secondary | ICD-10-CM | POA: Diagnosis present

## 2021-10-22 DIAGNOSIS — R45851 Suicidal ideations: Secondary | ICD-10-CM | POA: Diagnosis present

## 2021-10-22 DIAGNOSIS — F32A Depression, unspecified: Secondary | ICD-10-CM | POA: Diagnosis present

## 2021-10-22 DIAGNOSIS — Z20822 Contact with and (suspected) exposure to covid-19: Secondary | ICD-10-CM | POA: Diagnosis present

## 2021-10-22 DIAGNOSIS — T431X2A Poisoning by monoamine-oxidase-inhibitor antidepressants, intentional self-harm, initial encounter: Secondary | ICD-10-CM | POA: Diagnosis not present

## 2021-10-22 DIAGNOSIS — Z9151 Personal history of suicidal behavior: Secondary | ICD-10-CM | POA: Diagnosis not present

## 2021-10-22 LAB — CBC
HCT: 33.6 % — ABNORMAL LOW (ref 36.0–46.0)
Hemoglobin: 10.5 g/dL — ABNORMAL LOW (ref 12.0–15.0)
MCH: 24.1 pg — ABNORMAL LOW (ref 26.0–34.0)
MCHC: 31.3 g/dL (ref 30.0–36.0)
MCV: 77.1 fL — ABNORMAL LOW (ref 80.0–100.0)
Platelets: 209 10*3/uL (ref 150–400)
RBC: 4.36 MIL/uL (ref 3.87–5.11)
RDW: 14.6 % (ref 11.5–15.5)
WBC: 3.5 10*3/uL — ABNORMAL LOW (ref 4.0–10.5)
nRBC: 0 % (ref 0.0–0.2)

## 2021-10-22 LAB — TSH: TSH: 4.967 u[IU]/mL — ABNORMAL HIGH (ref 0.350–4.500)

## 2021-10-22 LAB — BASIC METABOLIC PANEL
Anion gap: 6 (ref 5–15)
BUN: 6 mg/dL (ref 6–20)
CO2: 25 mmol/L (ref 22–32)
Calcium: 8.5 mg/dL — ABNORMAL LOW (ref 8.9–10.3)
Chloride: 108 mmol/L (ref 98–111)
Creatinine, Ser: 0.55 mg/dL (ref 0.44–1.00)
GFR, Estimated: >60 mL/min (ref >60)
Glucose, Bld: 97 mg/dL (ref 70–99)
Potassium: 4.2 mmol/L (ref 3.5–5.1)
Sodium: 139 mmol/L (ref 135–145)

## 2021-10-22 LAB — T4, FREE: Free T4: 0.94 ng/dL (ref 0.61–1.12)

## 2021-10-22 MED ORDER — PRENATAL MULTIVITAMIN CH
1.0000 | ORAL_TABLET | Freq: Every day | ORAL | Status: DC
  Administered 2021-10-23 – 2021-10-25 (×3): 1 via ORAL
  Filled 2021-10-22 (×3): qty 1

## 2021-10-22 MED ORDER — ALUM & MAG HYDROXIDE-SIMETH 200-200-20 MG/5ML PO SUSP: 30.0000 mL | ORAL | Status: DC | PRN

## 2021-10-22 MED ORDER — ONDANSETRON HCL 4 MG PO TABS: 4.0000 mg | ORAL_TABLET | Freq: Four times a day (QID) | ORAL | Status: DC | PRN

## 2021-10-22 MED ORDER — ONDANSETRON HCL 4 MG/2ML IJ SOLN
4.0000 mg | Freq: Four times a day (QID) | INTRAMUSCULAR | Status: DC | PRN
  Filled 2021-10-22: qty 2

## 2021-10-22 NOTE — Consult Note (Addendum)
[facility] Consult   Reason for Consult:  Overdose, intentional Referring Physician:  [facility] Patient Identification: Isabella Bennett MRN:  [id number] Principal Diagnosis: Antidepressant overdose Diagnosis:  Principal Problem:   Antidepressant overdose Active Problems:   Depression   Total Time spent with patient: 1 hour  Subjective:   Isabella Bennett is a 30 y.o. female patient admitted with intentional overdose.  HPI: Patient seen and chart reviewed.  Patient is quiet and cooperative.  Interviewed alone, not in the presence of husband.  Patient states that she started having depression about 3 or 4 years ago after her daughter was born.  She denies any feelings of depression prior to that.  Patient states "a lot of things changed after the baby."  Patient denies ever seeing a therapist, but did see psychiatrist, Dr. Nicolasa Ducking.  Patient states that she was given Effexor but stopped taking it and she did not think it helped.  This is the medication that she used to intentionally overdose.  Patient insists that there was no particular reason why she felt more depressed at this time just that she got tired of feeling depressed and took the overdose.  Patient expresses that she is glad to be alive and is happy that it did not work.  Patient does appear depressed.  She denies any current suicidal ideations.  Denies auditory or visual hallucinations.  Denies having thoughts of harming anyone else.  Patient denies any use of illicit drugs.  Drinks alcohol only occasionally.  Patient states that she has a hard time staying asleep and that her appetite has decreased.  Patient does not wish to have psychiatric inpatient psychiatric hospitalization, however, this is recommended.   Collateral from patient's husband with her permission: [name] husband is visiting, spoke with him privately. He says there has been some discord in the marriage and he reports trying to get her to see a therapist without  success. He states "everything was fine" prior to her pregnancy. Their daughter is 51 yrs old. Husband does not want patient admitted because he says "I have been dealing with her for the last 3 years. She just needs more attention from me." Husband states he wants to take her home because they have a lot of family support and she won't be alone. He states he is a Software engineer by training and he will make sure she goes to the outpatient psychiatrist and starts seeing a therapist. He states he has locked up all medications. Advised that I have consulted with psychiatrist on the inpatient unit and we will be admitting patient.    Past Psychiatric History: Depression since birth of her 12-year-old daughter. No prior hospitalization.   Risk to Self:   Risk to Others:   Prior Inpatient Therapy:   Prior Outpatient Therapy:    Past Medical History:  Past Medical History:  Diagnosis Date   Medical history non-contributory     Past Surgical History:  Procedure Laterality Date   CESAREAN SECTION  07/21/2017   Procedure: CESAREAN SECTION;  Surgeon: Schermerhorn, Gwen Her, MD;  Location: [facility] ORS;  Service: Obstetrics;;   Family History: History reviewed. No pertinent family history. Family Psychiatric  History: denies Social History:  Social History   Substance and Sexual Activity  Alcohol Use No     Social History   Substance and Sexual Activity  Drug Use No    Social History   Socioeconomic History   Marital status: Married    Spouse name: Not on file  Number of children: Not on file   Years of education: Not on file   Highest education level: Not on file  Occupational History   Not on file  Tobacco Use   Smoking status: Never   Smokeless tobacco: Never  Substance and Sexual Activity   Alcohol use: No   Drug use: No   Sexual activity: Yes  Other Topics Concern   Not on file  Social History Narrative   Not on file   Social Determinants of Health   Financial Resource Strain:  Not on file  Food Insecurity: Not on file  Transportation Needs: Not on file  Physical Activity: Not on file  Stress: Not on file  Social Connections: Not on file   Additional Social History:    Allergies:  No Known Allergies  Labs:  Results for orders placed or performed during the hospital encounter of 10/21/21 (from the past 48 hour(s))  Urine Drug Screen, Qualitative     Status: None   Collection Time: 10/21/21  6:11 PM  Result Value Ref Range   Tricyclic, Ur Screen NONE DETECTED NONE DETECTED   Amphetamines, Ur Screen NONE DETECTED NONE DETECTED   MDMA (Ecstasy)Ur Screen NONE DETECTED NONE DETECTED   Cocaine Metabolite,Ur [place] NONE DETECTED NONE DETECTED   Opiate, Ur Screen NONE DETECTED NONE DETECTED   Phencyclidine (PCP) Ur S NONE DETECTED NONE DETECTED   Cannabinoid 50 Ng, Ur [place] NONE DETECTED NONE DETECTED   Barbiturates, Ur Screen NONE DETECTED NONE DETECTED   Benzodiazepine, Ur Scrn NONE DETECTED NONE DETECTED   Methadone Scn, Ur NONE DETECTED NONE DETECTED    Comment: (NOTE) Tricyclics + metabolites, urine    Cutoff 1000 ng/mL Amphetamines + metabolites, urine  Cutoff 1000 ng/mL MDMA (Ecstasy), urine              Cutoff 500 ng/mL Cocaine Metabolite, urine          Cutoff 300 ng/mL Opiate + metabolites, urine        Cutoff 300 ng/mL Phencyclidine (PCP), urine         Cutoff 25 ng/mL Cannabinoid, urine                 Cutoff 50 ng/mL Barbiturates + metabolites, urine  Cutoff 200 ng/mL Benzodiazepine, urine              Cutoff 200 ng/mL Methadone, urine                   Cutoff 300 ng/mL  The urine drug screen provides only a preliminary, unconfirmed analytical test result and should not be used for non-medical purposes. Clinical consideration and professional judgment should be applied to any positive drug screen result due to possible interfering substances. A more specific alternate chemical method must be used in order to obtain a confirmed analytical  result. Gas chromatography / mass spectrometry (GC/MS) is the preferred confirm atory method. Performed at [facility], [address] [place], [place] [address]   POC urine preg, ED     Status: None   Collection Time: 10/21/21  6:13 PM  Result Value Ref Range   Preg Test, Ur Negative Negative  Resp Panel by RT-PCR (Flu A&B, Covid) Nasopharyngeal Swab     Status: None   Collection Time: 10/21/21  6:36 PM   Specimen: Nasopharyngeal Swab; Nasopharyngeal(NP) swabs in vial transport medium  Result Value Ref Range   SARS Coronavirus 2 by RT PCR NEGATIVE NEGATIVE    Comment: (NOTE) SARS-CoV-2  target nucleic acids are NOT DETECTED.  The SARS-CoV-2 RNA is generally detectable in upper respiratory specimens during the acute phase of infection. The lowest concentration of SARS-CoV-2 viral copies this assay can detect is 138 copies/mL. A negative result does not preclude SARS-Cov-2 infection and should not be used as the sole basis for treatment or other patient management decisions. A negative result may occur with  improper specimen collection/handling, submission of specimen other than nasopharyngeal swab, presence of viral mutation(s) within the areas targeted by this assay, and inadequate number of viral copies(<138 copies/mL). A negative result must be combined with clinical observations, patient history, and epidemiological information. The expected result is Negative.  Fact Sheet for Patients:  EntrepreneurPulse.com.au  Fact Sheet for Healthcare Providers:  IncredibleEmployment.be  This test is no t yet approved or cleared by the [place] FDA and  has been authorized for detection and/or diagnosis of SARS-CoV-2 by FDA under an Emergency Use Authorization (EUA). This EUA will remain  in effect (meaning this test can be used) for the duration of the COVID-19 declaration under Section 564(b)(1) of the Act, 21 U.S.C.section  360bbb-3(b)(1), unless the authorization is terminated  or revoked sooner.       Influenza A by PCR NEGATIVE NEGATIVE   Influenza B by PCR NEGATIVE NEGATIVE    Comment: (NOTE) The Xpert Xpress SARS-CoV-2/FLU/RSV plus assay is intended as an aid in the diagnosis of influenza from Nasopharyngeal swab specimens and should not be used as a sole basis for treatment. Nasal washings and aspirates are unacceptable for Xpert Xpress SARS-CoV-2/FLU/RSV testing.  Fact Sheet for Patients: EntrepreneurPulse.com.au  Fact Sheet for Healthcare Providers: IncredibleEmployment.be  This test is not yet approved or cleared by the [place] FDA and has been authorized for detection and/or diagnosis of SARS-CoV-2 by FDA under an Emergency Use Authorization (EUA). This EUA will remain in effect (meaning this test can be used) for the duration of the COVID-19 declaration under Section 564(b)(1) of the Act, 21 U.S.C. section 360bbb-3(b)(1), unless the authorization is terminated or revoked.  Performed at [facility], [address] [place], [place] [address]   Comprehensive metabolic panel     Status: Abnormal   Collection Time: 10/21/21  6:36 PM  Result Value Ref Range   Sodium 142 135 - 145 mmol/L   Potassium 3.8 3.5 - 5.1 mmol/L   Chloride 112 (H) 98 - 111 mmol/L   CO2 23 22 - 32 mmol/L   Glucose, Bld 87 70 - 99 mg/dL    Comment: Glucose reference range applies only to samples taken after fasting for at least 8 hours.   BUN 6 6 - 20 mg/dL   Creatinine, Ser 0.51 0.44 - 1.00 mg/dL   Calcium 8.8 (L) 8.9 - 10.3 mg/dL   Total Protein 7.3 6.5 - 8.1 g/dL   Albumin 3.7 3.5 - 5.0 g/dL   AST 21 15 - 41 U/L   ALT 11 0 - 44 U/L   Alkaline Phosphatase 40 38 - 126 U/L   Total Bilirubin 0.5 0.3 - 1.2 mg/dL   GFR, Estimated >60 >60 mL/min    Comment: (NOTE) Calculated using the CKD-EPI Creatinine Equation (2021)    Anion gap 7 5 - 15    Comment:  Performed at [facility], [address] [place], [place] [address]  CBC with Diff     Status: Abnormal   Collection Time: 10/21/21  6:36 PM  Result Value Ref Range   WBC 5.4 4.0 -  10.5 K/uL   RBC 4.75 3.87 - 5.11 MIL/uL   Hemoglobin 11.9 (L) 12.0 - 15.0 g/dL   HCT 37.2 36.0 - 46.0 %   MCV 78.3 (L) 80.0 - 100.0 fL   MCH 25.1 (L) 26.0 - 34.0 pg   MCHC 32.0 30.0 - 36.0 g/dL   RDW 14.8 11.5 - 15.5 %   Platelets 222 150 - 400 K/uL   nRBC 0.0 0.0 - 0.2 %   Neutrophils Relative % 71 %   Neutro Abs 3.8 1.7 - 7.7 K/uL   Lymphocytes Relative 24 %   Lymphs Abs 1.3 0.7 - 4.0 K/uL   Monocytes Relative 4 %   Monocytes Absolute 0.2 0.1 - 1.0 K/uL   Eosinophils Relative 1 %   Eosinophils Absolute 0.0 0.0 - 0.5 K/uL   Basophils Relative 0 %   Basophils Absolute 0.0 0.0 - 0.1 K/uL   Immature Granulocytes 0 %   Abs Immature Granulocytes 0.02 0.00 - 0.07 K/uL    Comment: Performed at [facility], [address] [place], [place] [address]  Acetaminophen level     Status: Abnormal   Collection Time: 10/21/21  7:35 PM  Result Value Ref Range   Acetaminophen (Tylenol), Serum <10 (L) 10 - 30 ug/mL    Comment: (NOTE) Therapeutic concentrations vary significantly. A range of 10-30 ug/mL  may be an effective concentration for many patients. However, some  are best treated at concentrations outside of this range. Acetaminophen concentrations >150 ug/mL at 4 hours after ingestion  and >50 ug/mL at 12 hours after ingestion are often associated with  toxic reactions.  Performed at [facility], [address] [place], [place] [address]   Ethanol     Status: None   Collection Time: 10/21/21  7:35 PM  Result Value Ref Range   Alcohol, Ethyl (B) <10 <10 mg/dL    Comment: (NOTE) Lowest detectable limit for serum alcohol is 10 mg/dL.  For medical purposes only. Performed at [facility], [address] [place], [place] [address]   Salicylate level      Status: Abnormal   Collection Time: 10/21/21  7:35 PM  Result Value Ref Range   Salicylate Lvl <6.3 (L) 7.0 - 30.0 mg/dL    Comment: Performed at [facility], [address] [place], [place] [address]  Basic metabolic panel     Status: Abnormal   Collection Time: 10/22/21  5:57 AM  Result Value Ref Range   Sodium 139 135 - 145 mmol/L   Potassium 4.2 3.5 - 5.1 mmol/L   Chloride 108 98 - 111 mmol/L   CO2 25 22 - 32 mmol/L   Glucose, Bld 97 70 - 99 mg/dL    Comment: Glucose reference range applies only to samples taken after fasting for at least 8 hours.   BUN 6 6 - 20 mg/dL   Creatinine, Ser 0.55 0.44 - 1.00 mg/dL   Calcium 8.5 (L) 8.9 - 10.3 mg/dL   GFR, Estimated >60 >60 mL/min    Comment: (NOTE) Calculated using the CKD-EPI Creatinine Equation (2021)    Anion gap 6 5 - 15    Comment: Performed at [facility], [address] [place], [place] [address]  CBC     Status: Abnormal   Collection Time: 10/22/21  5:57 AM  Result Value Ref Range   WBC 3.5 (L) 4.0 - 10.5 K/uL   RBC 4.36 3.87 - 5.11 MIL/uL   Hemoglobin 10.5 (L) 12.0 -  15.0 g/dL   HCT 33.6 (L) 36.0 - 46.0 %   MCV 77.1 (L) 80.0 - 100.0 fL   MCH 24.1 (L) 26.0 - 34.0 pg   MCHC 31.3 30.0 - 36.0 g/dL   RDW 14.6 11.5 - 15.5 %   Platelets 209 150 - 400 K/uL   nRBC 0.0 0.0 - 0.2 %    Comment: Performed at [facility], [address], [place], [place] [address]    Current Facility-Administered Medications  Medication Dose Route Frequency Provider Last Rate Last Admin   0.9 % NaCl with KCl 20 mEq/ L  infusion   Intravenous Continuous Mansy, Jan A, MD 100 mL/hr at 10/22/21 0023 New Bag at 10/22/21 0023   acetaminophen (TYLENOL) tablet 650 mg  650 mg Oral Q6H PRN Mansy, Jan A, MD       Or   acetaminophen (TYLENOL) suppository 650 mg  650 mg Rectal Q6H PRN Mansy, Jan A, MD       enoxaparin (LOVENOX) injection 40 mg  40 mg Subcutaneous Q24H Mansy, Jan A, MD   40 mg at 10/22/21 0023   magnesium  hydroxide (MILK OF MAGNESIA) suspension 30 mL  30 mL Oral Daily PRN Mansy, Jan A, MD       ondansetron [facility]) tablet 4 mg  4 mg Oral Q6H PRN Mansy, Jan A, MD       Or   ondansetron [facility]) injection 4 mg  4 mg Intravenous Q6H PRN Mansy, Jan A, MD       prenatal multivitamin tablet 1 tablet  1 tablet Oral Q1200 Mansy, Jan A, MD       traZODone (DESYREL) tablet 25 mg  25 mg Oral QHS PRN Mansy, Arvella Merles, MD       Current Outpatient Medications  Medication Sig Dispense Refill   neomycin-polymyxin b-dexamethasone (MAXITROL) 3.5-10000-0.1 SUSP 1 drop 3 (three) times daily.     spironolactone (ALDACTONE) 50 MG tablet Take 1 tablet (50 mg total) by mouth daily. (Patient not taking: Reported on 10/21/2021) 30 tablet 2    Musculoskeletal: Strength & Muscle Tone:  patient in bed, did not observe Gait & Station:  did not observe Patient leans: N/A  Psychiatric Specialty Exam:  Presentation  General Appearance: Appropriate for Environment Eye Contact:Fair Speech:Clear and Coherent Speech Volume:Normal Handedness:No data recorded  Mood and Affect  Mood:Depressed Affect:Blunt  Thought Process  Thought Processes:Coherent Descriptions of Associations:Intact  Orientation:Full (Time, Place and Person)  Thought Content:WDL  History of Schizophrenia/Schizoaffective disorder:No data recorded Duration of Psychotic Symptoms:No data recorded Hallucinations:Hallucinations: None  Ideas of Reference:None  Suicidal Thoughts:Suicidal Thoughts: No (Not currently. Here for intentional overdose)  Homicidal Thoughts:Homicidal Thoughts: No   Sensorium  Memory:Immediate Fair Judgment:Poor Insight:Fair  Executive Functions  Concentration:No data recorded Attention Span:Fair [place]  Psychomotor Activity  Psychomotor Activity:Psychomotor Activity: Normal  Assets  Assets:Desire for Improvement; Financial Resources/Insurance; Housing; Intimacy;  Social Support; Resilience  Sleep  Sleep:Sleep: Poor  Physical Exam: Physical Exam Vitals and nursing note reviewed.  HENT:     Head: Normocephalic.     Nose: No congestion or rhinorrhea.  Eyes:     General:        Right eye: No discharge.        Left eye: No discharge.  Cardiovascular:     Rate and Rhythm: Normal rate.  Pulmonary:     Effort: Pulmonary effort is normal.  Musculoskeletal:        General: Normal range of motion.  Cervical back: Normal range of motion.  Skin:    General: Skin is dry.  Neurological:     Mental Status: She is alert and oriented to person, place, and time.  Psychiatric:        Attention and Perception: Attention normal.        Mood and Affect: Mood is depressed.        Speech: Speech normal.        Behavior: Behavior is cooperative.        Thought Content: Thought content includes suicidal (overdose) ideation. Thought content includes suicidal plan.        Cognition and Memory: Cognition normal.        Judgment: Judgment is impulsive.   Review of Systems  Eyes: Negative.   Respiratory: Negative.    Cardiovascular: Negative.   Genitourinary: Negative.   Skin: Negative.   Psychiatric/Behavioral:  Positive for depression and suicidal ideas (hx of). Negative for hallucinations, memory loss and substance abuse. The patient is nervous/anxious. The patient does not have insomnia.   Blood pressure 108/72, pulse 63, temperature 98.4 F (36.9 C), temperature source Oral, resp. rate 14, height 5\' 2"  (1.575 m), weight 68 kg, last menstrual period 10/21/2021, SpO2 100 %, unknown if currently breastfeeding. Body mass index is 27.44 kg/m.  Treatment Plan Summary: Daily contact with patient to assess and evaluate symptoms and progress in treatment, Medication management, and Plan admit to inpatient psychiatry  when medically clear. Ordered thyroid function tests.  Disposition: Recommend psychiatric Inpatient admission when medically cleared.  Sherlon Handing, NP 10/22/2021 10:57 AM

## 2021-10-22 NOTE — ED Notes (Signed)
Family at bedside. 

## 2021-10-22 NOTE — ED Notes (Signed)
IVC/pending psych consult/medical clearance

## 2021-10-22 NOTE — BH Assessment (Signed)
Patient is to be admitted to [facility] today 10/22/21 by Psychiatric Nurse Practitioner Waylan Boga.  Attending Physician will be Dr.  Dwyane Dee .   Patient has been assigned to room 307, by [facility] Charge Nurse, Coralyn Mark.    ER staff is aware of the admission:  Seth Bake, Patient Access.

## 2021-10-22 NOTE — Discharge Summary (Signed)
Physician Discharge Summary  Isabella Bennett KXF:[id number] DOB: 07-Feb-1991 DOA: 10/21/2021  PCP: Isabella Lighter, MD  Admit date: 10/21/2021 Discharge date: 10/22/2021  Admitted From: Home Disposition: Inpatient psych  Discharge Condition: Stable CODE STATUS: Full Diet recommendation: As tolerated  Brief/Interim Summary: Isabella Bennett is a 30 y.o. female with medical history significant for depression and generalized anxiety disorder as well as hypothyroidism during pregnancy, who presented to the ER with acute onset of Effexor overdose.  Toxicology contacted and intake recommending 18 hours of observation after activated charcoal administration.  [name] labs are stable, mild anemia, hypocalcemia and microcytic anemia appear to be somewhat chronic.  TSH minimally elevated but T4 within appropriate limits.  Patient evaluated by psychiatry team who is recommending further inpatient care and hospitalization.  At this time patient is medically stable for discharge, otherwise awaiting disposition details from psychiatry team.  We appreciate their insight and recommendations.  Discharge Diagnoses:  Principal Problem:   Antidepressant overdose Active Problems:   Depression  Discharge Instructions  Discharge Instructions     Discharge patient   Complete by: As directed    Psychiatric inpatient   Discharge disposition: [place] Not Defined   Discharge patient date: 10/22/2021      Allergies as of 10/22/2021   No Known Allergies      Medication List     STOP taking these medications    spironolactone 50 MG tablet Commonly known as: ALDACTONE       TAKE these medications    neomycin-polymyxin b-dexamethasone 3.5-10000-0.1 Susp Commonly known as: MAXITROL 1 drop 3 (three) times daily.        No Known Allergies  Consultations: Psychiatry  Procedures/Studies: No results found.   Subjective: No acute issues or events overnight denies  nausea vomiting diarrhea constipation headache fevers chills or chest pain.  Family at bedside indicates she appears at baseline.   Discharge Exam: Vitals:   10/22/21 0500 10/22/21 0741  BP: 107/75 108/72  Pulse: 60 63  Resp: 16 14  Temp:    SpO2: 98% 100%   Vitals:   10/22/21 0100 10/22/21 0415 10/22/21 0500 10/22/21 0741  BP: 96/82 (!) 96/57 107/75 108/72  Pulse: 60 (!) 56 60 63  Resp: 16 16 16 14   Temp:      TempSrc:      SpO2: 97% 99% 98% 100%  Weight:      Height:        General: Pt is alert, awake, not in acute distress Cardiovascular: RRR, S1/S2 +, no rubs, no gallops Respiratory: CTA bilaterally, no wheezing, no rhonchi Abdominal: Soft, NT, ND, bowel sounds + Extremities: no edema, no cyanosis    The results of significant diagnostics from this hospitalization (including imaging, microbiology, ancillary and laboratory) are listed below for reference.     Microbiology: Recent Results (from the past 240 hour(s))  Resp Panel by RT-PCR (Flu A&B, Covid) Nasopharyngeal Swab     Status: None   Collection Time: 10/21/21  6:36 PM   Specimen: Nasopharyngeal Swab; Nasopharyngeal(NP) swabs in vial transport medium  Result Value Ref Range Status   SARS Coronavirus 2 by RT PCR NEGATIVE NEGATIVE Final    Comment: (NOTE) SARS-CoV-2 target nucleic acids are NOT DETECTED.  The SARS-CoV-2 RNA is generally detectable in upper respiratory specimens during the acute phase of infection. The lowest concentration of SARS-CoV-2 viral copies this assay can detect is 138 copies/mL. A negative result does not preclude SARS-Cov-2 infection and should not be used as the  sole basis for treatment or other patient management decisions. A negative result may occur with  improper specimen collection/handling, submission of specimen other than nasopharyngeal swab, presence of viral mutation(s) within the areas targeted by this assay, and inadequate number of viral copies(<138 copies/mL). A  negative result must be combined with clinical observations, patient history, and epidemiological information. The expected result is Negative.  Fact Sheet for Patients:  EntrepreneurPulse.com.au  Fact Sheet for Healthcare Providers:  IncredibleEmployment.be  This test is no t yet approved or cleared by the [place] FDA and  has been authorized for detection and/or diagnosis of SARS-CoV-2 by FDA under an Emergency Use Authorization (EUA). This EUA will remain  in effect (meaning this test can be used) for the duration of the COVID-19 declaration under Section 564(b)(1) of the Act, 21 U.S.C.section 360bbb-3(b)(1), unless the authorization is terminated  or revoked sooner.       Influenza A by PCR NEGATIVE NEGATIVE Final   Influenza B by PCR NEGATIVE NEGATIVE Final    Comment: (NOTE) The Xpert Xpress SARS-CoV-2/FLU/RSV plus assay is intended as an aid in the diagnosis of influenza from Nasopharyngeal swab specimens and should not be used as a sole basis for treatment. Nasal washings and aspirates are unacceptable for Xpert Xpress SARS-CoV-2/FLU/RSV testing.  Fact Sheet for Patients: EntrepreneurPulse.com.au  Fact Sheet for Healthcare Providers: IncredibleEmployment.be  This test is not yet approved or cleared by the [place] FDA and has been authorized for detection and/or diagnosis of SARS-CoV-2 by FDA under an Emergency Use Authorization (EUA). This EUA will remain in effect (meaning this test can be used) for the duration of the COVID-19 declaration under Section 564(b)(1) of the Act, 21 U.S.C. section 360bbb-3(b)(1), unless the authorization is terminated or revoked.  Performed at [facility], [address] [place], [place] [address]      Labs: BNP (last 3 results) No results for input(s): BNP in the last 8760 hours. Basic Metabolic Panel: Recent Labs  Lab 10/21/21 1836  10/22/21 0557  NA 142 139  K 3.8 4.2  CL 112* 108  CO2 23 25  GLUCOSE 87 97  BUN 6 6  CREATININE 0.51 0.55  CALCIUM 8.8* 8.5*   Liver Function Tests: Recent Labs  Lab 10/21/21 1836  AST 21  ALT 11  ALKPHOS 40  BILITOT 0.5  PROT 7.3  ALBUMIN 3.7   No results for input(s): LIPASE, AMYLASE in the last 168 hours. No results for input(s): AMMONIA in the last 168 hours. CBC: Recent Labs  Lab 10/21/21 1836 10/22/21 0557  WBC 5.4 3.5*  NEUTROABS 3.8  --   HGB 11.9* 10.5*  HCT 37.2 33.6*  MCV 78.3* 77.1*  PLT 222 209   Cardiac Enzymes: No results for input(s): CKTOTAL, CKMB, CKMBINDEX, TROPONINI in the last 168 hours. BNP: Invalid input(s): POCBNP CBG: No results for input(s): GLUCAP in the last 168 hours. D-Dimer No results for input(s): DDIMER in the last 72 hours. Hgb A1c No results for input(s): HGBA1C in the last 72 hours. Lipid Profile No results for input(s): CHOL, HDL, LDLCALC, TRIG, CHOLHDL, LDLDIRECT in the last 72 hours. Thyroid function studies Recent Labs    10/22/21 0557  TSH 4.967*   Anemia work up No results for input(s): VITAMINB12, FOLATE, FERRITIN, TIBC, IRON, RETICCTPCT in the last 72 hours. Urinalysis No results found for: COLORURINE, APPEARANCEUR, [place], [place], GLUCOSEU, [place], [place], KETONESUR, PROTEINUR, UROBILINOGEN, NITRITE, LEUKOCYTESUR Sepsis Labs Invalid input(s): PROCALCITONIN,  WBC,  LACTICIDVEN Microbiology Recent Results (from  the past 240 hour(s))  Resp Panel by RT-PCR (Flu A&B, Covid) Nasopharyngeal Swab     Status: None   Collection Time: 10/21/21  6:36 PM   Specimen: Nasopharyngeal Swab; Nasopharyngeal(NP) swabs in vial transport medium  Result Value Ref Range Status   SARS Coronavirus 2 by RT PCR NEGATIVE NEGATIVE Final    Comment: (NOTE) SARS-CoV-2 target nucleic acids are NOT DETECTED.  The SARS-CoV-2 RNA is generally detectable in upper respiratory specimens during the acute phase of infection. The  lowest concentration of SARS-CoV-2 viral copies this assay can detect is 138 copies/mL. A negative result does not preclude SARS-Cov-2 infection and should not be used as the sole basis for treatment or other patient management decisions. A negative result may occur with  improper specimen collection/handling, submission of specimen other than nasopharyngeal swab, presence of viral mutation(s) within the areas targeted by this assay, and inadequate number of viral copies(<138 copies/mL). A negative result must be combined with clinical observations, patient history, and epidemiological information. The expected result is Negative.  Fact Sheet for Patients:  EntrepreneurPulse.com.au  Fact Sheet for Healthcare Providers:  IncredibleEmployment.be  This test is no t yet approved or cleared by the [place] FDA and  has been authorized for detection and/or diagnosis of SARS-CoV-2 by FDA under an Emergency Use Authorization (EUA). This EUA will remain  in effect (meaning this test can be used) for the duration of the COVID-19 declaration under Section 564(b)(1) of the Act, 21 U.S.C.section 360bbb-3(b)(1), unless the authorization is terminated  or revoked sooner.       Influenza A by PCR NEGATIVE NEGATIVE Final   Influenza B by PCR NEGATIVE NEGATIVE Final    Comment: (NOTE) The Xpert Xpress SARS-CoV-2/FLU/RSV plus assay is intended as an aid in the diagnosis of influenza from Nasopharyngeal swab specimens and should not be used as a sole basis for treatment. Nasal washings and aspirates are unacceptable for Xpert Xpress SARS-CoV-2/FLU/RSV testing.  Fact Sheet for Patients: EntrepreneurPulse.com.au  Fact Sheet for Healthcare Providers: IncredibleEmployment.be  This test is not yet approved or cleared by the [place] FDA and has been authorized for detection and/or diagnosis of SARS-CoV-2 by FDA under  an Emergency Use Authorization (EUA). This EUA will remain in effect (meaning this test can be used) for the duration of the COVID-19 declaration under Section 564(b)(1) of the Act, 21 U.S.C. section 360bbb-3(b)(1), unless the authorization is terminated or revoked.  Performed at [facility], [address] [place], [place] [address]      Time coordinating discharge: Over 30 minutes  SIGNED:   Little Ishikawa, DO [facility] 10/22/2021, 12:00 PM Pager   If 7PM-7AM, please contact night-coverage [email or web address]

## 2021-10-22 NOTE — Plan of Care (Signed)
Care Plan Initiated.     Problem: Education: Goal: Emotional status will improve Outcome: Progressing   Problem: Coping: Goal: Ability to verbalize frustrations and anger appropriately will improve Outcome: Progressing Goal: Ability to demonstrate self-control will improve Outcome: Progressing   Problem: Safety: Goal: Periods of time without injury will increase Outcome: Progressing

## 2021-10-22 NOTE — Progress Notes (Signed)
Patient calm and pleasant during assessment denying SI/HI/AVH. Pt observed interacting appropriately with staff and peers on the unit. Pt didn't have any medications scheduled tonight and hasn't requested anything PRN as this writing. Pt given education, support, and encouragement to be active in her treatment plan. Pt being monitored Q 15 minutes for safety per unit protocol. Pt remains safe on the unit.

## 2021-10-22 NOTE — Group Note (Signed)
LCSW Group Therapy Note   Group Date: 10/22/2021 Start Time: 1300 End Time: 1400   Type of Therapy and Topic:  Group Therapy: Boundaries  Participation Level:  Did Not Attend  Description of Group: This group will address the use of boundaries in their personal lives. Patients will explore why boundaries are important, the difference between healthy and unhealthy boundaries, and negative and postive outcomes of different boundaries and will look at how boundaries can be crossed.  Patients will be encouraged to identify current boundaries in their own lives and identify what kind of boundary is being set. Facilitators will guide patients in utilizing problem-solving interventions to address and correct types boundaries being used and to address when no boundary is being used. Understanding and applying boundaries will be explored and addressed for obtaining and maintaining a balanced life. Patients will be encouraged to explore ways to assertively make their boundaries and needs known to significant others in their lives, using other group members and facilitator for role play, support, and feedback.  Therapeutic Goals:  1.  Patient will identify areas in their life where setting clear boundaries could be  used to improve their life.  2.  Patient will identify signs/triggers that a boundary is not being respected. 3.  Patient will identify two ways to set boundaries in order to achieve balance in  their lives: 4.  Patient will demonstrate ability to communicate their needs and set boundaries  through discussion and/or role plays  Summary of Patient Progress:   Group not held due to acuity on the unit.    Therapeutic Modalities:   Cognitive Behavioral Therapy Solution-Focused Therapy  Larose Kells 10/22/2021  4:08 PM

## 2021-10-22 NOTE — Tx Team (Signed)
Initial Treatment Plan 10/22/2021 3:20 PM Isabella Bennett UXN:[id number]    PATIENT STRESSORS: Health problems   Marital or family conflict     PATIENT STRENGTHS: Ability for insight  Active sense of humor  Average or above average intelligence  Capable of independent living  Engineer, drilling fund of knowledge  Motivation for treatment/growth  Physical Health  Religious Affiliation  Special hobby/interest  Supportive family/friends  Work skills    PATIENT IDENTIFIED PROBLEMS: Suicide attempt 10/22/21  Depression 12/20/222                   DISCHARGE CRITERIA:  Improved stabilization in mood, thinking, and/or behavior Motivation to continue treatment in a less acute level of care Need for constant or close observation no longer present Verbal commitment to aftercare and medication compliance  PRELIMINARY DISCHARGE PLAN: Outpatient therapy Participate in family therapy Return to previous living arrangement Return to previous work or school arrangements  PATIENT/FAMILY INVOLVEMENT: This treatment plan has been presented to and reviewed with the patient, Isabella Bennett.The patient has been given the opportunity to ask questions and make suggestions.  Reyes Ivan, RN 10/22/2021, 3:20 PM

## 2021-10-22 NOTE — Progress Notes (Signed)
Received patient from [facility] Emergency Department. Patient skin assessment completed with Cara, BHT, skin is intact, no contraband found with all unit prohibited items locked and stored away for discharge. Pt. Was admitted under the services of, Dr. Dwyane Dee.  Patient oriented to unit/room/call light. Pt. Given extensive admissions education. Patient was encourage to participate in unit activities and continue with plan of care being put into place. Q x 15 minute observation checks were initiated for safety.   Patient is receptive to treatment  being put into place and safety to be maintained on unit per MD orders. Pt. Endorsed that prior to admission she had taken a bunch of pills, but not to kill herself, but to show her family that "I could do it it if I wanted to really die." Pt. Endorsed that she has not before admission or currently have suicidal or homicidal ideations, she denied auditory or visual hallucinations, she denied physical pain, her orientation was grossly intact, and she endorsed an ability to remain safe on the unit. Pt. Given supplies for hygiene, welcome packet, and new scrubs to change into. Pt. Educated on how to order meal for dinner time today. Pt. Special diet input by provider team (I.e., vegetarian).

## 2021-10-22 NOTE — ED Notes (Signed)
Pt is medically cleared per Dr. Avon Gully at this time. Pending psych admission.

## 2021-10-23 MED ORDER — ESCITALOPRAM OXALATE 10 MG PO TABS
5.0000 mg | ORAL_TABLET | Freq: Every day | ORAL | Status: DC
  Administered 2021-10-23 – 2021-10-25 (×3): 5 mg via ORAL
  Filled 2021-10-23 (×3): qty 1

## 2021-10-23 NOTE — BHH Suicide Risk Assessment (Signed)
[facility] INPATIENT:  Family/Significant Other Suicide Prevention Education  Suicide Prevention Education:  Education Completed; Astryd Pearcy, cousin, [phone] has been identified by the patient as the family member/significant other with whom the patient will be residing, and identified as the person(s) who will aid the patient in the event of a mental health crisis (suicidal ideations/suicide attempt).  With written consent from the patient, the family member/significant other has been provided the following suicide prevention education, prior to the and/or following the discharge of the patient.  The suicide prevention education provided includes the following: Suicide risk factors Suicide prevention and interventions National Suicide Hotline telephone number [facility] assessment telephone number [facility] Emergency Assistance [place] and/or Residential Mobile Crisis Unit telephone number  Request made of family/significant other to: Remove weapons (e.g., guns, rifles, knives), all items previously/currently identified as safety concern.   Remove drugs/medications (over-the-counter, prescriptions, illicit drugs), all items previously/currently identified as a safety concern.  The family member/significant other verbalizes understanding of the suicide prevention education information provided.  The family member/significant other agrees to remove the items of safety concern listed above.  He reports that "I think she and her husband are not having a good relationship technically".  He reports that "I know that they're under a great deal of stress, she's been mood lately, that may have played a factor".  He reports that the patient could be a danger to self or others.  He reports "this is not the first time that she has done this before".  He reports "she left the house "a few times threatening to hurt herself". He reports that "when she would leave the house she would  get drunk and call [husband] and tell him to pick her up".  He reports that "this an ongoing issue, I would be surprised if she hurt herself, she has threatened to harm herself so many times".  He reports that the patient does not have access to weapons.  He reports "she has a severe anger issues".  He reports that "you can see her tantrum at home, it's a totally different person".  He reports "yelling and screaming" are part of tantrum.  He reports that he doesn't know about any other anger behaviors and recommended that CSW speak with the patients husband.  He reports that "I am very concerned for her safety if she keeps doing this. She needs deep counseling and someone to watch her."  Rozann Lesches 10/23/2021, 3:42 PM

## 2021-10-23 NOTE — B[facility] Suicide Risk Assessment (Cosign Needed)
[facility] Admission Suicide Risk Assessment   Nursing information obtained from:  Patient Demographic factors:  Unemployed Current Mental Status:  Self-harm behaviors, Belief that plan would result in death Loss Factors:  Decline in physical health Historical Factors:  Prior suicide attempts Risk Reduction Factors:  Responsible for children under 31 years of age, Sense of responsibility to family, Religious beliefs about death, Living with another person, especially a relative, Positive social support, Positive therapeutic relationship  Total Time spent with patient: 1.5 hours Principal Problem: Major depressive disorder, recurrent severe without psychotic features ([place]) Diagnosis:  Principal Problem:   Major depressive disorder, recurrent severe without psychotic features ([place])  Subjective Data: Client with high depression and anxiety, suicide attempt by overdose.  Panic attacks at times. Endorses feelings of worthlessness, anhedonia, and negativity.    Continued Clinical Symptoms:  Alcohol Use Disorder Identification Test Final Score (AUDIT): 2 The "Alcohol Use Disorders Identification Test", Guidelines for Use in Primary Care, Second Edition.  World Pharmacologist [facility]). Score between 0-7:  no or low risk or alcohol related problems. Score between 8-15:  moderate risk of alcohol related problems. Score between 16-19:  high risk of alcohol related problems. Score 20 or above:  warrants further diagnostic evaluation for alcohol dependence and treatment.   CLINICAL FACTORS:   Panic Attacks Depression:   Anhedonia Impulsivity Severe   Musculoskeletal: Strength & Muscle Tone: within normal limits Gait & Station: normal Patient leans: N/A  Psychiatric Specialty Exam: Physical Exam Vitals and nursing note reviewed.  Constitutional:      Appearance: Normal appearance.  HENT:     Head: Normocephalic.     Nose: Nose normal.  Pulmonary:     Effort: Pulmonary effort is normal.   Musculoskeletal:        General: Normal range of motion.     Cervical back: Normal range of motion.  Neurological:     General: No focal deficit present.     Mental Status: She is alert and oriented to person, place, and time.  Psychiatric:        Attention and Perception: Attention and perception normal.        Mood and Affect: Mood is anxious and depressed.        Speech: Speech normal.        Behavior: Behavior normal. Behavior is cooperative.        Thought Content: Thought content includes suicidal ideation.        Cognition and Memory: Cognition and memory normal.        Judgment: Judgment is impulsive.    Review of Systems  Psychiatric/Behavioral:  Positive for depression and suicidal ideas. The patient is nervous/anxious.   All other systems reviewed and are negative.  Blood pressure 101/64, pulse (!) 59, temperature 98.4 F (36.9 C), temperature source Oral, resp. rate 18, height 5\' 2"  (1.575 m), weight 58.5 kg, last menstrual period 10/21/2021, SpO2 100 %, unknown if currently breastfeeding.Body mass index is 23.59 kg/m.  General Appearance: Casual  Eye Contact:  Fair  Speech:  Normal Rate  Volume:  Normal  Mood:  Anxious and Depressed  Affect:  Congruent  Thought Process:  Coherent and Descriptions of Associations: Intact  Orientation:  Full (Time, Place, and Person)  Thought Content:  Rumination  Suicidal Thoughts:  Yes.  without intent/plan  Homicidal Thoughts:  No  Memory:  Immediate;   Fair Recent;   Fair Remote;   Fair  Judgement:  Poor  Insight:  Fair  Psychomotor Activity:  Decreased  Concentration:  Concentration: Fair and Attention Span: Fair  Recall:  [employer] of Knowledge:  Good  Language:  Good  Akathisia:  No  Handed:  Right  AIMS (if indicated):     Assets:  Housing Leisure Time Physical Health Resilience Social Support Vocational/Educational  ADL's:  Intact  Cognition:  WNL  Sleep:  Number of Hours: 8.25      Physical  Exam: Physical Exam Vitals and nursing note reviewed.  Constitutional:      Appearance: Normal appearance.  HENT:     Head: Normocephalic.     Nose: Nose normal.  Pulmonary:     Effort: Pulmonary effort is normal.  Musculoskeletal:        General: Normal range of motion.     Cervical back: Normal range of motion.  Neurological:     General: No focal deficit present.     Mental Status: She is alert and oriented to person, place, and time.  Psychiatric:        Attention and Perception: Attention normal.        Mood and Affect: Mood is anxious and depressed.        Speech: Speech normal.        Behavior: Behavior normal. Behavior is cooperative.        Thought Content: Thought content includes suicidal ideation.        Cognition and Memory: Cognition and memory normal.        Judgment: Judgment is impulsive.   Review of Systems  Neurological:  Positive for dizziness.  Psychiatric/Behavioral:  Positive for depression and suicidal ideas. The patient is nervous/anxious.   All other systems reviewed and are negative. Blood pressure 101/64, pulse (!) 59, temperature 98.4 F (36.9 C), temperature source Oral, resp. rate 18, height 5\' 2"  (1.575 m), weight 58.5 kg, last menstrual period 10/21/2021, SpO2 100 %, unknown if currently breastfeeding. Body mass index is 23.59 kg/m.   COGNITIVE FEATURES THAT CONTRIBUTE TO RISK:  None    SUICIDE RISK:   Mild:  Suicidal ideation of limited frequency, intensity, duration, and specificity.  There are no identifiable plans, no associated intent, mild dysphoria and related symptoms, good self-control (both objective and subjective assessment), few other risk factors, and identifiable protective factors, including available and accessible social support.  PLAN OF CARE:  Major depressive disorder, recurrent, severe without psychosis: -Start Lexapro 5 mg daily -Individual and group therapy  I certify that inpatient services furnished can reasonably  be expected to improve the patient's condition.   Waylan Boga, NP 10/23/2021, 3:05 PM

## 2021-10-23 NOTE — BH IP Treatment Plan (Signed)
Interdisciplinary Treatment and Diagnostic Plan Update  10/23/2021 Time of Session: 9:30AM Isabella Bennett MRN: [id number]  Principal Diagnosis: Major depressive disorder, recurrent severe without psychotic features ([place])  Secondary Diagnoses: Principal Problem:   Major depressive disorder, recurrent severe without psychotic features ([place])   Current Medications:  Current Facility-Administered Medications  Medication Dose Route Frequency Provider Last Rate Last Admin   alum & mag hydroxide-simeth (MAALOX/MYLANTA) 200-200-20 MG/5ML suspension 30 mL  30 mL Oral Q4H PRN Patrecia Pour, NP       ondansetron (ZOFRAN) tablet 4 mg  4 mg Oral Q6H PRN Patrecia Pour, NP       Or   ondansetron (ZOFRAN) injection 4 mg  4 mg Intravenous Q6H PRN Patrecia Pour, NP       prenatal multivitamin tablet 1 tablet  1 tablet Oral Q1200 Patrecia Pour, NP       PTA Medications: Medications Prior to Admission  Medication Sig Dispense Refill Last Dose   neomycin-polymyxin b-dexamethasone (MAXITROL) 3.5-10000-0.1 SUSP 1 drop 3 (three) times daily.       Patient Stressors: Health problems   Marital or family conflict    Patient Strengths: Ability for insight  Active sense of humor  Average or above average intelligence  Capable of independent living  Engineer, drilling fund of knowledge  Motivation for treatment/growth  Physical Health  Religious Affiliation  Special hobby/interest  Supportive family/friends  Work skills   Treatment Modalities: Medication Management, Group therapy, Case management,  1 to 1 session with clinician, Psychoeducation, Recreational therapy.   Physician Treatment Plan for Primary Diagnosis: Major depressive disorder, recurrent severe without psychotic features ([name]) Long Term Goal(s):     Short Term Goals:    Medication Management: Evaluate patient's response, side effects, and tolerance of medication regimen.  Therapeutic  Interventions: 1 to 1 sessions, Unit Group sessions and Medication administration.  Evaluation of Outcomes: Progressing  Physician Treatment Plan for Secondary Diagnosis: Principal Problem:   Major depressive disorder, recurrent severe without psychotic features ([name])  Long Term Goal(s):     Short Term Goals:       Medication Management: Evaluate patient's response, side effects, and tolerance of medication regimen.  Therapeutic Interventions: 1 to 1 sessions, Unit Group sessions and Medication administration.  Evaluation of Outcomes: Progressing   RN Treatment Plan for Primary Diagnosis: Major depressive disorder, recurrent severe without psychotic features ([place]) Long Term Goal(s): Knowledge of disease and therapeutic regimen to maintain health will improve  Short Term Goals: Ability to demonstrate self-control, Ability to participate in decision making will improve, Ability to verbalize feelings will improve, Ability to disclose and discuss suicidal ideas, Ability to identify and develop effective coping behaviors will improve, and Compliance with prescribed medications will improve  Medication Management: RN will administer medications as ordered by provider, will assess and evaluate patient's response and provide education to patient for prescribed medication. RN will report any adverse and/or side effects to prescribing provider.  Therapeutic Interventions: 1 on 1 counseling sessions, Psychoeducation, Medication administration, Evaluate responses to treatment, Monitor vital signs and CBGs as ordered, Perform/monitor CIWA, COWS, AIMS and Fall Risk screenings as ordered, Perform wound care treatments as ordered.  Evaluation of Outcomes: Progressing   LCSW Treatment Plan for Primary Diagnosis: Major depressive disorder, recurrent severe without psychotic features ([name]) Long Term Goal(s): Safe transition to appropriate next level of care at discharge, Engage patient in therapeutic  group addressing interpersonal concerns.  Short Term Goals: Engage patient  in aftercare planning with referrals and resources, Increase social support, Increase ability to appropriately verbalize feelings, Increase emotional regulation, Facilitate acceptance of mental health diagnosis and concerns, Identify triggers associated with mental health/substance abuse issues, and Increase skills for wellness and recovery  Therapeutic Interventions: Assess for all discharge needs, 1 to 1 time with Social worker, Explore available resources and support systems, Assess for adequacy in community support network, Educate family and significant other(s) on suicide prevention, Complete Psychosocial Assessment, Interpersonal group therapy.  Evaluation of Outcomes: Progressing   Progress in Treatment: Attending groups: No. Participating in groups: No. Taking medication as prescribed: Yes. Toleration medication: Yes. Family/Significant other contact made: No, will contact:  once permission is given.  Patient understands diagnosis: Yes. Discussing patient identified problems/goals with staff: Yes. Medical problems stabilized or resolved: Yes. Denies suicidal/homicidal ideation: Yes. Issues/concerns per patient self-inventory: No. Other: none  New problem(s) identified: No, Describe:  none  New Short Term/Long Term Goal(s): medication management for mood stabilization; elimination of SI thoughts; development of comprehensive mental wellness/sobriety plan.   Patient Goals:  "I want to get the medicine out of my head first, before starting a new one.  I can tell it is affecting me."  Discharge Plan or Barriers: CSW will assist the patient in developing appropriate discharge plans. Patient reports that she is open to therapy and medication management at discharge.   Reason for Continuation of Hospitalization: Anxiety Depression Medication stabilization Suicidal ideation  Estimated Length of Stay:  1-7  days   Scribe for Treatment Team: Rozann Lesches, LCSW 10/23/2021 11:09 AM

## 2021-10-23 NOTE — [facility] Counselor (Signed)
CSW provided patient with a list of providers in the area that accept her insurance.  Patient requested therapists that were of [place] descent if possible.   Pt indicated that she would like to follow up with Rolly Salter [phone] of [facility].  CSW called and left HIPAA compliant voicemail.  Assunta Curtis, MSW, LCSW 10/23/2021 3:55 PM

## 2021-10-23 NOTE — Progress Notes (Signed)
Pt denies SI/HI/AVH and verbally agrees to approach staff if these become apparent or before harming themselves/others. Rates depression 0/10. Rates anxiety 0/10. Rates pain 0/10. Pt was tearful during treatment team saying that "no one cares for me." Pt stated that when her husband talked about them being separated, she started having those thoughts. Pt now states that her husband is supportive of her and that he would make sure she is safe when she leaves. Pt asks when she is leaving even though she had discussed with the doctors being here for a few more days. Pt has been staying in the room for the majority of the day. Pt encouraged to go to groups. Scheduled medications administered to pt, per MD orders. RN provided support and encouragement to pt. Q15 min safety checks implemented and continued. Pt safe on the unit. RN will continue to monitor and intervene as needed.   10/23/21 1030  Psych Admission Type (Psych Patients Only)  Admission Status Involuntary  Psychosocial Assessment  Patient Complaints Sadness  Eye Contact Fair  Facial Expression Flat;Sad  Affect Flat  Speech Logical/coherent  Interaction Isolative  Motor Activity Other (Comment) (WDL)  Appearance/Hygiene Unremarkable  Behavior Characteristics Cooperative;Calm  Mood Sad;Fearful;Pleasant  Aggressive Behavior  Effect No apparent injury  Thought Process  Coherency WDL  Content WDL  Delusions None reported or observed  Perception WDL  Hallucination None reported or observed  Judgment Impaired  Confusion None  Danger to Self  Current suicidal ideation? Denies  Danger to Others  Danger to Others None reported or observed

## 2021-10-23 NOTE — H&P (Addendum)
Psychiatric Admission Assessment Adult  Patient Identification: Isabella Bennett MRN:  [id number] Date of Evaluation:  10/23/2021 Chief Complaint:  Major depressive disorder, recurrent severe without psychotic features ([place]) [F33.2] Principal Diagnosis: Major depressive disorder, recurrent severe without psychotic features ([place]) Diagnosis:  Principal Problem:   Major depressive disorder, recurrent severe without psychotic features ([place])  History of Present Illness:  30 yo female admitted after an overdose related to depression and anxiety along with stress of her in-laws living with her. She and her husband were discussing separating prior to her overdose, which triggered this.  "I don't feel anyone cares" as she is seen as the problem in her immediate household and going to live in an apartment.  Her husband does want to support her and for her to be happy per the client. High depression with anhedonia, loss of appetite, helplessness, and worthlessness.  Suicidal ideations on admission yesterday, glad she is alive today.  High anxiety with panic attacks at times, especially on the interstate with avoidance of driving on the freeway.  Denies psychosis, paranoia, homicidal ideations, and substance use.  Positive for anger especially regarding her in-laws, reports being a negative person and wants to be more positive.  Effexor makes her feel "dizzy" and does not want to take this anymore.  Associated Signs/Symptoms: Depression Symptoms:  depressed mood, anhedonia, suicidal attempt, anxiety, panic attacks, loss of energy/fatigue, disturbed sleep, weight loss, Duration of Depression Symptoms: No data recorded (Hypo) Manic Symptoms:   none Anxiety Symptoms:  Excessive Worry, Panic Symptoms, Psychotic Symptoms:   none PTSD Symptoms: NA Total Time spent with patient: 1.5 hours  Past Psychiatric History: depression and anxiety  Is the patient at risk to self? Yes.    Has the patient been a risk  to self in the past 6 months? No.  Has the patient been a risk to self within the distant past? No.  Is the patient a risk to others? No.  Has the patient been a risk to others in the past 6 months? No.  Has the patient been a risk to others within the distant past? No.   Prior Inpatient Therapy:  none Prior Outpatient Therapy:  psychiatrist  Alcohol Screening: 1. How often do you have a drink containing alcohol?: 2 to 4 times a month 2. How many drinks containing alcohol do you have on a typical day when you are drinking?: 1 or 2 3. How often do you have six or more drinks on one occasion?: Never AUDIT-C Score: 2 4. How often during the last year have you found that you were not able to stop drinking once you had started?: Never 5. How often during the last year have you failed to do what was normally expected from you because of drinking?: Never 6. How often during the last year have you needed a first drink in the morning to get yourself going after a heavy drinking session?: Never 7. How often during the last year have you had a feeling of guilt of remorse after drinking?: Never 8. How often during the last year have you been unable to remember what happened the night before because you had been drinking?: Never 9. Have you or someone else been injured as a result of your drinking?: No 10. Has a relative or friend or a doctor or another health worker been concerned about your drinking or suggested you cut down?: No Alcohol Use Disorder Identification Test Final Score (AUDIT): 2 Substance Abuse History in the last 12 months:  No. Consequences of Substance Abuse: NA Previous Psychotropic Medications: Yes  Psychological Evaluations: Yes  Past Medical History:  Past Medical History:  Diagnosis Date   Medical history non-contributory     Past Surgical History:  Procedure Laterality Date   CESAREAN SECTION  07/21/2017   Procedure: CESAREAN SECTION;  Surgeon: Schermerhorn, Gwen Her, MD;   Location: [facility] ORS;  Service: Obstetrics;;   Family History: History reviewed. No pertinent family history. Family Psychiatric  History: none Tobacco Screening:   Social History:  Social History   Substance and Sexual Activity  Alcohol Use No     Social History   Substance and Sexual Activity  Drug Use No    Additional Social History: Marital status: Married Number of Years Married: 8 What types of issues is patient dealing with in the relationship?: "just the family problems" Does patient have children?: Yes How many children?: 1 How is patient's relationship with their children?: "it is nice with my daughter"                         Allergies:  No Known Allergies Lab Results:  Results for orders placed or performed during the hospital encounter of 10/21/21 (from the past 45 hour(s))  Urine Drug Screen, Qualitative     Status: None   Collection Time: 10/21/21  6:11 PM  Result Value Ref Range   Tricyclic, Ur Screen NONE DETECTED NONE DETECTED   Amphetamines, Ur Screen NONE DETECTED NONE DETECTED   MDMA (Ecstasy)Ur Screen NONE DETECTED NONE DETECTED   Cocaine Metabolite,Ur [place] NONE DETECTED NONE DETECTED   Opiate, Ur Screen NONE DETECTED NONE DETECTED   Phencyclidine (PCP) Ur S NONE DETECTED NONE DETECTED   Cannabinoid 50 Ng, Ur  NONE DETECTED NONE DETECTED   Barbiturates, Ur Screen NONE DETECTED NONE DETECTED   Benzodiazepine, Ur Scrn NONE DETECTED NONE DETECTED   Methadone Scn, Ur NONE DETECTED NONE DETECTED    Comment: (NOTE) Tricyclics + metabolites, urine    Cutoff 1000 ng/mL Amphetamines + metabolites, urine  Cutoff 1000 ng/mL MDMA (Ecstasy), urine              Cutoff 500 ng/mL Cocaine Metabolite, urine          Cutoff 300 ng/mL Opiate + metabolites, urine        Cutoff 300 ng/mL Phencyclidine (PCP), urine         Cutoff 25 ng/mL Cannabinoid, urine                 Cutoff 50 ng/mL Barbiturates + metabolites, urine  Cutoff 200 ng/mL Benzodiazepine,  urine              Cutoff 200 ng/mL Methadone, urine                   Cutoff 300 ng/mL  The urine drug screen provides only a preliminary, unconfirmed analytical test result and should not be used for non-medical purposes. Clinical consideration and professional judgment should be applied to any positive drug screen result due to possible interfering substances. A more specific alternate chemical method must be used in order to obtain a confirmed analytical result. Gas chromatography / mass spectrometry (GC/MS) is the preferred confirm atory method. Performed at [facility], [address] [place], [place] [address]   POC urine preg, ED     Status: None   Collection Time: 10/21/21  6:13 PM  Result Value Ref Range   Preg Test, Ur  Negative Negative  Resp Panel by RT-PCR (Flu A&B, Covid) Nasopharyngeal Swab     Status: None   Collection Time: 10/21/21  6:36 PM   Specimen: Nasopharyngeal Swab; Nasopharyngeal(NP) swabs in vial transport medium  Result Value Ref Range   SARS Coronavirus 2 by RT PCR NEGATIVE NEGATIVE    Comment: (NOTE) SARS-CoV-2 target nucleic acids are NOT DETECTED.  The SARS-CoV-2 RNA is generally detectable in upper respiratory specimens during the acute phase of infection. The lowest concentration of SARS-CoV-2 viral copies this assay can detect is 138 copies/mL. A negative result does not preclude SARS-Cov-2 infection and should not be used as the sole basis for treatment or other patient management decisions. A negative result may occur with  improper specimen collection/handling, submission of specimen other than nasopharyngeal swab, presence of viral mutation(s) within the areas targeted by this assay, and inadequate number of viral copies(<138 copies/mL). A negative result must be combined with clinical observations, patient history, and epidemiological information. The expected result is Negative.  Fact Sheet for Patients:   EntrepreneurPulse.com.au  Fact Sheet for Healthcare Providers:  IncredibleEmployment.be  This test is no t yet approved or cleared by the [place] FDA and  has been authorized for detection and/or diagnosis of SARS-CoV-2 by FDA under an Emergency Use Authorization (EUA). This EUA will remain  in effect (meaning this test can be used) for the duration of the COVID-19 declaration under Section 564(b)(1) of the Act, 21 U.S.C.section 360bbb-3(b)(1), unless the authorization is terminated  or revoked sooner.       Influenza A by PCR NEGATIVE NEGATIVE   Influenza B by PCR NEGATIVE NEGATIVE    Comment: (NOTE) The Xpert Xpress SARS-CoV-2/FLU/RSV plus assay is intended as an aid in the diagnosis of influenza from Nasopharyngeal swab specimens and should not be used as a sole basis for treatment. Nasal washings and aspirates are unacceptable for Xpert Xpress SARS-CoV-2/FLU/RSV testing.  Fact Sheet for Patients: EntrepreneurPulse.com.au  Fact Sheet for Healthcare Providers: IncredibleEmployment.be  This test is not yet approved or cleared by the [place] FDA and has been authorized for detection and/or diagnosis of SARS-CoV-2 by FDA under an Emergency Use Authorization (EUA). This EUA will remain in effect (meaning this test can be used) for the duration of the COVID-19 declaration under Section 564(b)(1) of the Act, 21 U.S.C. section 360bbb-3(b)(1), unless the authorization is terminated or revoked.  Performed at [facility], [address] [place], [place] [address]   Comprehensive metabolic panel     Status: Abnormal   Collection Time: 10/21/21  6:36 PM  Result Value Ref Range   Sodium 142 135 - 145 mmol/L   Potassium 3.8 3.5 - 5.1 mmol/L   Chloride 112 (H) 98 - 111 mmol/L   CO2 23 22 - 32 mmol/L   Glucose, Bld 87 70 - 99 mg/dL    Comment: Glucose reference range applies only to  samples taken after fasting for at least 8 hours.   BUN 6 6 - 20 mg/dL   Creatinine, Ser 0.51 0.44 - 1.00 mg/dL   Calcium 8.8 (L) 8.9 - 10.3 mg/dL   Total Protein 7.3 6.5 - 8.1 g/dL   Albumin 3.7 3.5 - 5.0 g/dL   AST 21 15 - 41 U/L   ALT 11 0 - 44 U/L   Alkaline Phosphatase 40 38 - 126 U/L   Total Bilirubin 0.5 0.3 - 1.2 mg/dL   GFR, Estimated >60 >60 mL/min    Comment: (NOTE) Calculated using the  CKD-EPI Creatinine Equation (2021)    Anion gap 7 5 - 15    Comment: Performed at [facility], [address] [place], [place] [address]  CBC with Diff     Status: Abnormal   Collection Time: 10/21/21  6:36 PM  Result Value Ref Range   WBC 5.4 4.0 - 10.5 K/uL   RBC 4.75 3.87 - 5.11 MIL/uL   Hemoglobin 11.9 (L) 12.0 - 15.0 g/dL   HCT 37.2 36.0 - 46.0 %   MCV 78.3 (L) 80.0 - 100.0 fL   MCH 25.1 (L) 26.0 - 34.0 pg   MCHC 32.0 30.0 - 36.0 g/dL   RDW 14.8 11.5 - 15.5 %   Platelets 222 150 - 400 K/uL   nRBC 0.0 0.0 - 0.2 %   Neutrophils Relative % 71 %   Neutro Abs 3.8 1.7 - 7.7 K/uL   Lymphocytes Relative 24 %   Lymphs Abs 1.3 0.7 - 4.0 K/uL   Monocytes Relative 4 %   Monocytes Absolute 0.2 0.1 - 1.0 K/uL   Eosinophils Relative 1 %   Eosinophils Absolute 0.0 0.0 - 0.5 K/uL   Basophils Relative 0 %   Basophils Absolute 0.0 0.0 - 0.1 K/uL   Immature Granulocytes 0 %   Abs Immature Granulocytes 0.02 0.00 - 0.07 K/uL    Comment: Performed at [facility], [address] [place], [place] [address]  Acetaminophen level     Status: Abnormal   Collection Time: 10/21/21  7:35 PM  Result Value Ref Range   Acetaminophen (Tylenol), Serum <10 (L) 10 - 30 ug/mL    Comment: (NOTE) Therapeutic concentrations vary significantly. A range of 10-30 ug/mL  may be an effective concentration for many patients. However, some  are best treated at concentrations outside of this range. Acetaminophen concentrations >150 ug/mL at 4 hours after ingestion  and >50 ug/mL at 12 hours  after ingestion are often associated with  toxic reactions.  Performed at [facility], [address] [place], [place] [address]   Ethanol     Status: None   Collection Time: 10/21/21  7:35 PM  Result Value Ref Range   Alcohol, Ethyl (B) <10 <10 mg/dL    Comment: (NOTE) Lowest detectable limit for serum alcohol is 10 mg/dL.  For medical purposes only. Performed at [facility], [address] [place], [place] [address]   Salicylate level     Status: Abnormal   Collection Time: 10/21/21  7:35 PM  Result Value Ref Range   Salicylate Lvl <6.5 (L) 7.0 - 30.0 mg/dL    Comment: Performed at [facility], [address] [place], [place] [address]  Basic metabolic panel     Status: Abnormal   Collection Time: 10/22/21  5:57 AM  Result Value Ref Range   Sodium 139 135 - 145 mmol/L   Potassium 4.2 3.5 - 5.1 mmol/L   Chloride 108 98 - 111 mmol/L   CO2 25 22 - 32 mmol/L   Glucose, Bld 97 70 - 99 mg/dL    Comment: Glucose reference range applies only to samples taken after fasting for at least 8 hours.   BUN 6 6 - 20 mg/dL   Creatinine, Ser 0.55 0.44 - 1.00 mg/dL   Calcium 8.5 (L) 8.9 - 10.3 mg/dL   GFR, Estimated >60 >60 mL/min    Comment: (NOTE) Calculated using the CKD-EPI Creatinine Equation (2021)    Anion gap 6 5 - 15  Comment: Performed at [facility], [address] [place], [place] [address]  CBC     Status: Abnormal   Collection Time: 10/22/21  5:57 AM  Result Value Ref Range   WBC 3.5 (L) 4.0 - 10.5 K/uL   RBC 4.36 3.87 - 5.11 MIL/uL   Hemoglobin 10.5 (L) 12.0 - 15.0 g/dL   HCT 33.6 (L) 36.0 - 46.0 %   MCV 77.1 (L) 80.0 - 100.0 fL   MCH 24.1 (L) 26.0 - 34.0 pg   MCHC 31.3 30.0 - 36.0 g/dL   RDW 14.6 11.5 - 15.5 %   Platelets 209 150 - 400 K/uL   nRBC 0.0 0.0 - 0.2 %    Comment: Performed at [facility], [address] [place], [place] [address]  TSH     Status: Abnormal   Collection Time: 10/22/21  5:57  AM  Result Value Ref Range   TSH 4.967 (H) 0.350 - 4.500 uIU/mL    Comment: Performed by a 3rd Generation assay with a functional sensitivity of <=0.01 uIU/mL. Performed at [facility], [address] [place], [place] [address]   T4, free     Status: None   Collection Time: 10/22/21  5:57 AM  Result Value Ref Range   Free T4 0.94 0.61 - 1.12 ng/dL    Comment: (NOTE) Biotin ingestion may interfere with free T4 tests. If the results are inconsistent with the TSH level, previous test results, or the clinical presentation, then consider biotin interference. If needed, order repeat testing after stopping biotin. Performed at [facility], [address] [place], [place] [address]     Blood Alcohol level:  Lab Results  Component Value Date   [facility] <10 36/62/9476    Metabolic Disorder Labs:  No results found for: HGBA1C, MPG No results found for: PROLACTIN No results found for: CHOL, TRIG, HDL, CHOLHDL, VLDL, LDLCALC  Current Medications: Current Facility-Administered Medications  Medication Dose Route Frequency Provider Last Rate Last Admin   alum & mag hydroxide-simeth (MAALOX/MYLANTA) 200-200-20 MG/5ML suspension 30 mL  30 mL Oral Q4H PRN Patrecia Pour, NP       ondansetron [facility]) tablet 4 mg  4 mg Oral Q6H PRN Patrecia Pour, NP       Or   ondansetron [facility]) injection 4 mg  4 mg Intravenous Q6H PRN Patrecia Pour, NP       prenatal multivitamin tablet 1 tablet  1 tablet Oral Q1200 Patrecia Pour, NP   1 tablet at 10/23/21 1201   PTA Medications: Medications Prior to Admission  Medication Sig Dispense Refill Last Dose   neomycin-polymyxin b-dexamethasone (MAXITROL) 3.5-10000-0.1 SUSP 1 drop 3 (three) times daily.       Musculoskeletal: Strength & Muscle Tone: within normal limits Gait & Station: normal Patient leans: N/A  Psychiatric Specialty Exam: Physical Exam Vitals and nursing note reviewed.  Constitutional:      Appearance: Normal  appearance.  HENT:     Head: Normocephalic.     Nose: Nose normal.  Pulmonary:     Effort: Pulmonary effort is normal.  Musculoskeletal:        General: Normal range of motion.     Cervical back: Normal range of motion.  Neurological:     General: No focal deficit present.     Mental Status: She is alert and oriented to person, place, and time.  Psychiatric:        Attention and Perception: Attention and perception  normal.        Mood and Affect: Mood is anxious and depressed.        Speech: Speech normal.        Behavior: Behavior normal. Behavior is cooperative.        Thought Content: Thought content includes suicidal ideation.        Cognition and Memory: Cognition and memory normal.        Judgment: Judgment is impulsive.    Review of Systems  Constitutional: Negative.   HENT: Negative.    Respiratory: Negative.    Cardiovascular: Negative.   Gastrointestinal: Negative.   Endocrine: Negative.   Genitourinary: Negative.   Musculoskeletal: Negative.   Skin: Negative.   Allergic/Immunologic: Negative.   Neurological:  Positive for dizziness.  Hematological: Negative.   Psychiatric/Behavioral:  Positive for depression, dysphoric mood and suicidal ideas. The patient is nervous/anxious.   All other systems reviewed and are negative.  Blood pressure 101/64, pulse (!) 59, temperature 98.4 F (36.9 C), temperature source Oral, resp. rate 18, height 5\' 2"  (1.575 m), weight 58.5 kg, last menstrual period 10/21/2021, SpO2 100 %, unknown if currently breastfeeding.Body mass index is 23.59 kg/m.  General Appearance: Casual  Eye Contact:  Fair  Speech:  Normal Rate  Volume:  Normal  Mood:  Anxious and Depressed  Affect:  Congruent  Thought Process:  Coherent and Descriptions of Associations: Intact  Orientation:  Full (Time, Place, and Person)  Thought Content:  Rumination  Suicidal Thoughts:  Yes.  without intent/plan  Homicidal Thoughts:  No  Memory:  Immediate;    Fair Recent;   Fair Remote;   Fair  Judgement:  Poor  Insight:  Fair  Psychomotor Activity:  Decreased  Concentration:  Concentration: Fair and Attention Span: Fair  Recall:  [employer] of Knowledge:  Good  Language:  Good  Akathisia:  No  Handed:  Right  AIMS (if indicated):     Assets:  Housing Leisure Time Physical Health Resilience Social Support Vocational/Educational  ADL's:  Intact  Cognition:  WNL  Sleep:  Number of Hours: 8.25   kum    Physical Exam: Physical Exam Vitals and nursing note reviewed.  Constitutional:      Appearance: Normal appearance.  HENT:     Head: Normocephalic.     Nose: Nose normal.  Pulmonary:     Effort: Pulmonary effort is normal.  Musculoskeletal:        General: Normal range of motion.     Cervical back: Normal range of motion.  Neurological:     General: No focal deficit present.     Mental Status: She is alert and oriented to person, place, and time.  Psychiatric:        Attention and Perception: Attention and perception normal.        Mood and Affect: Mood is anxious and depressed.        Speech: Speech normal.        Behavior: Behavior normal. Behavior is cooperative.        Thought Content: Thought content includes suicidal ideation.        Cognition and Memory: Cognition and memory normal.        Judgment: Judgment is impulsive.   Review of Systems  Constitutional: Negative.   HENT: Negative.    Respiratory: Negative.    Cardiovascular: Negative.   Gastrointestinal: Negative.   Genitourinary: Negative.   Musculoskeletal: Negative.   Skin: Negative.   Neurological:  Positive for dizziness.  Psychiatric/Behavioral:  Positive for depression, dysphoric mood and suicidal ideas. The patient is nervous/anxious.   All other systems reviewed and are negative. Blood pressure 101/64, pulse (!) 59, temperature 98.4 F (36.9 C), temperature source Oral, resp. rate 18, height 5\' 2"  (1.575 m), weight 58.5 kg, last menstrual  period 10/21/2021, SpO2 100 %, unknown if currently breastfeeding. Body mass index is 23.59 kg/m.  Treatment Plan Summary: Daily contact with patient to assess and evaluate symptoms and progress in treatment, Medication management, and Plan : Major depressive disorder, recurrent, severe without psychosis: -Start Lexapro 5 mg daily -Individual and group therapy  Observation Level/Precautions:  15 minute checks  Laboratory:  TSH elevated, anemic (supposed to be on iron)  Psychotherapy:  Individual and group therapy  Medications:  Effexor 37.5 mg prior to admission  Consultations:  none  Discharge Concerns:  none  Estimated LOS: 5-7 days  Other:     Physician Treatment Plan for Primary Diagnosis: Major depressive disorder, recurrent severe without psychotic features ([place]) Long Term Goal(s): Improvement in symptoms so as ready for discharge  Short Term Goals: Ability to identify changes in lifestyle to reduce recurrence of condition will improve, Ability to verbalize feelings will improve, Ability to disclose and discuss suicidal ideas, Ability to demonstrate self-control will improve, Ability to identify and develop effective coping behaviors will improve, Ability to maintain clinical measurements within normal limits will improve, and Compliance with prescribed medications will improve  Physician Treatment Plan for Secondary Diagnosis: Principal Problem:   Major depressive disorder, recurrent severe without psychotic features ([place])  Long Term Goal(s): Improvement in symptoms so as ready for discharge  Short Term Goals: Ability to identify changes in lifestyle to reduce recurrence of condition will improve, Ability to verbalize feelings will improve, Ability to disclose and discuss suicidal ideas, Ability to demonstrate self-control will improve, Ability to identify and develop effective coping behaviors will improve, Ability to maintain clinical measurements within normal limits will  improve, and Compliance with prescribed medications will improve  I certify that inpatient services furnished can reasonably be expected to improve the patient's condition.    Waylan Boga, NP 12/21/20222:55 PM

## 2021-10-23 NOTE — B[facility] Counselor (Signed)
Adult Comprehensive Assessment  Patient ID: Isabella Bennett, female   DOB: Oct 05, 1991, 30 y.o.   MRN: [id number]  Information Source: Information source: Patient  Current Stressors:  Patient states their primary concerns and needs for treatment are:: "I tried to take the Effexxor, like 5 of those, I was in the emergency room and they put me in here." Patient states their goals for this hospitilization and ongoing recovery are:: "I just want to get better with no depression and anxiety.  I just want to be happy." Educational / Learning stressors: Pt denies. Employment / Job issues: Pt denies. Family Relationships: "with my mother-in-law, she's old and her thoughts are different than mine, she also has OCDPublishing copy / Lack of resources (include bankruptcy): Pt denies. Housing / Lack of housing: Pt denies. Physical health (include injuries & life threatening diseases): Pt denies. Social relationships: Pt denies. Substance abuse: "alcohol" Bereavement / Loss: Pt denies.  Living/Environment/Situation:  Living Arrangements: Spouse/significant other, Children, Other relatives Who else lives in the home?: "my husband, daughter, a niece, two other nieces every other weekend, my in-laws and my parents and grandfather will be visiting next month" How long has patient lived in current situation?: "5 years" What is atmosphere in current home: Other (Comment) ("they say the mood depends on me")  Family History:  Marital status: Married Number of Years Married: 8 What types of issues is patient dealing with in the relationship?: "just the family problems" Does patient have children?: Yes How many children?: 1 How is patient's relationship with their children?: "it is nice with my daughter"  Childhood History:  By whom was/is the patient raised?: Both parents Description of patient's relationship with caregiver when they were a child: "it's just nice because I was an only child" [name] description  of current relationship with people who raised him/her: "it's still good too, it was a nice relationship" How were you disciplined when you got in trouble as a child/adolescent?: "I was an average child, I was made to apologize" Does patient have siblings?: No Did patient suffer any verbal/emotional/physical/sexual abuse as a child?: No Did patient suffer from severe childhood neglect?: No Has patient ever been sexually abused/assaulted/raped as an adolescent or adult?: No Was the patient ever a victim of a crime or a disaster?: No Witnessed domestic violence?: No Has patient been affected by domestic violence as an adult?: No  Education:  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" Has Patient ever Been in the [employer]?: No  Financial Resources:   Financial resources: Income from employment, Multimedia programmer, Income from spouse Does patient have a representative payee or guardian?: No  Alcohol/Substance Abuse:   What has been your use of drugs/alcohol within the last 12 months?: Alcohol: "only on the weekends and a maximum of 4 shots" If attempted suicide, did drugs/alcohol play a role in this?: No Alcohol/Substance Abuse Treatment Hx: Denies past history Has alcohol/substance abuse ever caused legal problems?: No  Social Support System:   [name] Community Support System: Good Describe  Community Support System: "my husband" Type of faith/religion: "Hinduism" How  does patient's faith help to cope with current illness?: "Pray to my God"  Leisure/Recreation:   Do You Have Hobbies?: Yes Leisure and Hobbies: "shopping"  Strengths/Needs:   What is the patient's perception of their strengths?: "hardworking, dedicated" Patient states they can use these personal strengths during their treatment to contribute to their recovery: "it makes me feel good and proud" Patient states these barriers may affect/interfere with their treatment: Pt denies. Patient states these barriers may affect their return to the community: Pt denies.  Discharge Plan:   Currently receiving community mental health services: No Patient states concerns and preferences for aftercare planning are: Pt reports that she is open to a referral for a psychiatrist and therapist. Patient states they will know when they are safe and ready for discharge when: "one thing I know is I wont attempt suicide again" Does patient have access to transportation?: Yes Does patient have financial barriers related to discharge medications?: No Will patient be returning to same living situation after discharge?: Yes  Summary/Recommendations:   Summary and Recommendations (to be completed by the evaluator): Patient is a 30 year old married female [facility] [place] [place] [facility]  She was admitted from the emergency department for an intentional overdose on her medications.  She reports that she has increased feelings of depression.  She reports that depression began after the birth of her daughter and have worsened over the past couple of years.  She denies auditory and visual hallucinations as well as current suicidal or homicidal ideations.  She reports that she drinks alcohols on the weekends and at most will have four shots in one sitting.  She identifies current triggers as a somewhat conflictual relationship with her in laws.  Per chart review, conversation with the patients husband indicate that the patients  husband reports recent marital discord.  She reports that she is not current with a therapist or psychiatrist, though is open to a referral at discharge.  Recommendations include: crisis stabilization, therapeutic milieu, encourage group attendance and participation, medication management for mood stabilization and development of comprehensive mental wellness plan.  Rozann Lesches. 10/23/2021

## 2021-10-23 NOTE — Group Note (Signed)
LCSW Group Therapy Note   Group Date: 10/23/2021 Start Time: 1300 End Time: 1400   Type of Therapy and Topic:  Group Therapy: Boundaries  Participation Level:  Active  Description of Group: This group will address the use of boundaries in their personal lives. Patients will explore why boundaries are important, the difference between healthy and unhealthy boundaries, and negative and postive outcomes of different boundaries and will look at how boundaries can be crossed.  Patients will be encouraged to identify current boundaries in their own lives and identify what kind of boundary is being set. Facilitators will guide patients in utilizing problem-solving interventions to address and correct types boundaries being used and to address when no boundary is being used. Understanding and applying boundaries will be explored and addressed for obtaining and maintaining a balanced life. Patients will be encouraged to explore ways to assertively make their boundaries and needs known to significant others in their lives, using other group members and facilitator for role play, support, and feedback.  Therapeutic Goals:  1.  Patient will identify areas in their life where setting clear boundaries could be  used to improve their life.  2.  Patient will identify signs/triggers that a boundary is not being respected. 3.  Patient will identify two ways to set boundaries in order to achieve balance in  their lives: 4.  Patient will demonstrate ability to communicate their needs and set boundaries  through discussion and/or role plays  Summary of Patient Progress:    Patient was present for the entirety of the group session. Patient was an active listener and participated in the topic of discussion, provided helpful advice to others, and added nuance to topic of conversation. Patient states that she has difficulty establishing boundaries with her family. Patient is hesitant to provide details. Patient  shares minimally, though she is respectful of others in the group.   Therapeutic Modalities:   Cognitive Behavioral Therapy Solution-Focused Therapy  Larose Kells 10/23/2021  3:07 PM

## 2021-10-24 LAB — T3 UPTAKE: T3 Uptake Ratio: 32 % (ref 24–39)

## 2021-10-24 MED ORDER — KATE FARMS STANDARD 1.4 PO LIQD
325.0000 mL | Freq: Three times a day (TID) | ORAL | Status: DC
  Administered 2021-10-24 – 2021-10-25 (×3): 325 mL via ORAL
  Filled 2021-10-24: qty 325

## 2021-10-24 NOTE — Progress Notes (Signed)
Pt has been visible on the unit, interacting with peers and staff. She denies SI/HI and AVH. She is vegetarian and has an order for a diet consult. She watched tv and attend some of the groups. She would like to be discharged, I told her she can discuss with it her doctor tomorrow. She is very guarded about discussing her mental health. Her affect has been a little less depressed and she has been out of her room more.

## 2021-10-24 NOTE — Plan of Care (Signed)
See progress note Problem: Education: Goal: Emotional status will improve 10/24/2021 0942 by Thressa Sheller, RN Outcome: Not Progressing 10/24/2021 0941 by Thressa Sheller, RN Outcome: Progressing   Problem: Coping: Goal: Ability to verbalize frustrations and anger appropriately will improve 10/24/2021 0942 by Thressa Sheller, RN Outcome: Not Progressing 10/24/2021 0941 by Thressa Sheller, RN Outcome: Progressing Goal: Ability to demonstrate self-control will improve 10/24/2021 0942 by Thressa Sheller, RN Outcome: Not Progressing 10/24/2021 0941 by Thressa Sheller, RN Outcome: Progressing   Problem: Safety: Goal: Periods of time without injury will increase 10/24/2021 0942 by Thressa Sheller, RN Outcome: Not Progressing 10/24/2021 0941 by Thressa Sheller, RN Outcome: Progressing

## 2021-10-24 NOTE — Progress Notes (Signed)
Patient is active on the unit this evening and is hanging out in the dayroom.   Gets along well with other females of similar age.  She denies si/hi/avh depression and anxiety at this encounter. Patient has no QHS meds to take tonight and states that she believes she will be able to sleep tonight.  Encouraged her to come to staff with any question or concerns.  Will continue to monitor with q15 minute safety checks.   Genia Del, LPN

## 2021-10-24 NOTE — Progress Notes (Addendum)
[facility] MD Progress Note  10/24/2021 10:57 AM Isabella Bennett  MRN:  [id number] Subjective:  Pt interviewed, chart reviewed. Pt look lexapro yesterday evening, which kept her awake until 3am (then she slept for 3 hrs). Pt took lexapro this morning. Pt reports still feeling depressed, with decreased appetite. Pt is vegetarian, so feels she has limited food options here. Pt is asking to go home, bc she has not seen her daughter in a few days. Pt wants to stay busy at home, to help her feel less depressed. Pt requesting to seen an [place] therapist after discharge. Principal Problem: Major depressive disorder, recurrent severe without psychotic features ([place]) Diagnosis: Principal Problem:   Major depressive disorder, recurrent severe without psychotic features ([place])  Total Time spent with patient: 30 minutes  Past Psychiatric History: overdosed on 6 effexor tabs on admission, due to feeling lonely. Pt saw Dr. Cephus Shelling 1 yr ago (so had leftover effexor tabs).  Past Medical History:  Past Medical History:  Diagnosis Date   Medical history non-contributory     Past Surgical History:  Procedure Laterality Date   CESAREAN SECTION  07/21/2017   Procedure: CESAREAN SECTION;  Surgeon: Schermerhorn, Gwen Her, MD;  Location: [facility] ORS;  Service: Obstetrics;;   Family History: History reviewed. No pertinent family history. Family Psychiatric  History: unremarkableo Social History:  Social History   Substance and Sexual Activity  Alcohol Use No     Social History   Substance and Sexual Activity  Drug Use No    Social History   Socioeconomic History   Marital status: Married    Spouse name: Not on file   Number of children: Not on file   Years of education: Not on file   Highest education level: Not on file  Occupational History   Not on file  Tobacco Use   Smoking status: Never    Passive exposure: Never   Smokeless tobacco: Never  Substance and Sexual Activity   Alcohol use: No   Drug  use: No   Sexual activity: Yes  Other Topics Concern   Not on file  Social History Narrative   Not on file   Social Determinants of Health   Financial Resource Strain: Not on file  Food Insecurity: Not on file  Transportation Needs: Not on file  Physical Activity: Not on file  Stress: Not on file  Social Connections: Not on file   Additional Social History:                         Sleep:  poor last night, due to taking lexapro late in evening  Appetite:  Poor  Current Medications: Current Facility-Administered Medications  Medication Dose Route Frequency Provider Last Rate Last Admin   alum & mag hydroxide-simeth (MAALOX/MYLANTA) 200-200-20 MG/5ML suspension 30 mL  30 mL Oral Q4H PRN Patrecia Pour, NP       escitalopram (LEXAPRO) tablet 5 mg  5 mg Oral Daily Patrecia Pour, NP   5 mg at 10/24/21 0827   ondansetron (ZOFRAN) tablet 4 mg  4 mg Oral Q6H PRN Patrecia Pour, NP       Or   ondansetron (ZOFRAN) injection 4 mg  4 mg Intravenous Q6H PRN Patrecia Pour, NP       prenatal multivitamin tablet 1 tablet  1 tablet Oral Q1200 Patrecia Pour, NP   1 tablet at 10/23/21 1201    Lab Results:  Results for orders  placed or performed during the hospital encounter of 10/22/21 (from the past 48 hour(s))  T3 uptake     Status: None   Collection Time: 10/23/21  7:37 AM  Result Value Ref Range   T3 Uptake Ratio 32 24 - 39 %    Comment: (NOTE) Performed At: [facility] [place], [place] [id number] Rush Farmer MD [place]:[id number]     Blood Alcohol level:  Lab Results  Component Value Date   ETH <10 94/85/4627    Metabolic Disorder Labs: No results found for: HGBA1C, MPG No results found for: PROLACTIN No results found for: CHOL, TRIG, HDL, CHOLHDL, VLDL, LDLCALC  Physical Findings: AIMS: Facial and Oral Movements Muscles of Facial Expression: None, normal Lips and Perioral Area: None, normal Jaw: None, normal Tongue: None,  normal,Extremity Movements Upper (arms, wrists, hands, fingers): None, normal Lower (legs, knees, ankles, toes): None, normal, Trunk Movements Neck, shoulders, hips: None, normal, Overall Severity Severity of abnormal movements (highest score from questions above): None, normal Incapacitation due to abnormal movements: None, normal [name] awareness of abnormal movements (rate only patient's report): No Awareness, Dental Status Current problems with teeth and/or dentures?: No Does patient usually wear dentures?: No  CIWA:    COWS:     Musculoskeletal: Strength & Muscle Tone: within normal limits Gait & Station: normal Patient leans: N/A  Psychiatric Specialty Exam: Psychiatric Specialty Exam: Physical Exam Vitals and nursing note reviewed.  Constitutional:      Appearance: Normal appearance.  HENT:     Head: Normocephalic.     Nose: Nose normal.     Mouth/Throat:     Mouth: Mucous membranes are moist.  Eyes:     Pupils: Pupils are equal, round, and reactive to light.  Cardiovascular:     Rate and Rhythm: Normal rate.  Pulmonary:     Effort: Pulmonary effort is normal.  Musculoskeletal:        General: Normal range of motion.     Cervical back: Normal range of motion.  Skin:    General: Skin is warm and dry.  Neurological:     General: No focal deficit present.    Review of Systems  All other systems reviewed and are negative.  Blood pressure 93/70, pulse 76, temperature 98.3 F (36.8 C), temperature source Oral, resp. rate 18, height 5\' 2"  (1.575 m), weight 58.5 kg, last menstrual period 10/21/2021, SpO2 99 %, unknown if currently breastfeeding.Body mass index is 23.59 kg/m.  General Appearance: Disheveled and Guarded  Eye Sport and exercise psychologist::  Fair  Speech:  Slow  Volume:  Decreased  Mood:  Depressed and Dysphoric  Affect:  Depressed and Restricted  Thought Process:  Coherent and Linear  Orientation:  Full (Time, Place, and Person)  Thought Content:  Negative  Suicidal  Thoughts:  No  Homicidal Thoughts:  No  Memory:  Negative  Judgement:  Impaired  Insight:  Shallow  Psychomotor Activity:  Decreased  Concentration:  Poor  Recall:  Poor  Akathisia:  Negative  Handed:  Right  AIMS (if indicated):     Assets:  Desire for Improvement Housing Social Support  Sleep:  Number of Hours: 5.45    Presentation  General Appearance: Appropriate for Environment  Eye Contact:Fair  Speech:Clear and Coherent  Speech Volume:Normal  Handedness:No data recorded  Mood and Affect  Mood:Depressed  Affect:Blunt   Thought Process  Thought Processes:Coherent  Descriptions of Associations:Intact  Orientation:Full (Time, Place and Person)  Thought Content:WDL  History of Schizophrenia/Schizoaffective disorder:No data recorded  Duration of Psychotic Symptoms:No data recorded Hallucinations:No data recorded Ideas of Reference:None  Suicidal Thoughts:No data recorded Homicidal Thoughts:No data recorded  Sensorium  Memory:Immediate Fair  Judgment:Poor  Insight:Fair   Executive Functions  Concentration:No data recorded Attention Span:Fair  [place]   Psychomotor Activity  Psychomotor Activity:No data recorded  Assets  Assets:Desire for Improvement; Financial Resources/Insurance; Housing; Intimacy; Social Support; Resilience   Sleep  Sleep:No data recorded   Physical Exam: Physical Exam Vitals and nursing note reviewed.  Constitutional:      Appearance: Normal appearance.  HENT:     Head: Normocephalic.     Nose: Nose normal.     Mouth/Throat:     Mouth: Mucous membranes are moist.  Eyes:     Pupils: Pupils are equal, round, and reactive to light.  Cardiovascular:     Rate and Rhythm: Normal rate.  Pulmonary:     Effort: Pulmonary effort is normal.  Musculoskeletal:        General: Normal range of motion.     Cervical back: Normal range of motion.  Skin:    General: Skin is  warm and dry.  Neurological:     General: No focal deficit present.   Review of Systems  All other systems reviewed and are negative. Blood pressure 93/70, pulse 76, temperature 98.3 F (36.8 C), temperature source Oral, resp. rate 18, height 5\' 2"  (1.575 m), weight 58.5 kg, last menstrual period 10/21/2021, SpO2 99 %, unknown if currently breastfeeding. Body mass index is 23.59 kg/m.   Treatment Plan Summary: Daily contact with patient to assess and evaluate symptoms and progress in treatment, Medication management, and Plan continue lexapro 5 mg daily targeting depression. Ordered dietary consult ( poor appetite, vegetarian). Refer to [place] therapist (consider teletherapy). Pt to attend groups.  Waylan Boga, NP 10/24/2021, 10:57 AM  This pt was seen by Dereck Leep, MD today.

## 2021-10-24 NOTE — Group Note (Signed)
[facility] LCSW Group Therapy Note   Group Date: 10/24/2021 Start Time: 1300 End Time: 1400   Type of Therapy/Topic:  Group Therapy:  Balance in Life  Participation Level:  Active   Description of Group:    This group will address the concept of balance and how it feels and looks when one is unbalanced. Patients will be encouraged to process areas in their lives that are out of balance, and identify reasons for remaining unbalanced. Facilitators will guide patients utilizing problem- solving interventions to address and correct the stressor making their life unbalanced. Understanding and applying boundaries will be explored and addressed for obtaining  and maintaining a balanced life. Patients will be encouraged to explore ways to assertively make their unbalanced needs known to significant others in their lives, using other group members and facilitator for support and feedback.  Therapeutic Goals: Patient will identify two or more emotions or situations they have that consume much of in their lives. Patient will identify signs/triggers that life has become out of balance:  Patient will identify two ways to set boundaries in order to achieve balance in their lives:  Patient will demonstrate ability to communicate their needs through discussion and/or role plays  Summary of Patient Progress: Patient was present in group.  Patient was an active participant. Patient arrived to group late.  Patient discussed how taking a walk, reading and listening to music as coping skills.   Therapeutic Modalities:   Cognitive Behavioral Therapy Solution-Focused Therapy Assertiveness Training   Rozann Lesches, LCSW

## 2021-10-24 NOTE — Progress Notes (Signed)
Recreation Therapy Notes  Date: 10/24/2021  Time: 10:30 am    Location: Craft room    Behavioral response: Appropriate   Intervention Topic: Time Management   Discussion/Intervention:  Group content today was focused on time management. The group defined time management and identified healthy ways to manage time. Individuals expressed how much of the 24 hours they use in a day. Patients expressed how much time they use just for themselves personally. The group expressed how they have managed their time in the past. Individuals participated in the intervention Managing Life where they had a chance to see how much of the 24 hours they use and where it goes.  Clinical Observations/Feedback: Patient came to group late and expressed that she manages her time by thinking about her next day at night. She stated that she uses 4-5 hours productively during the day. Individual was social with peers and staff while participating in the intervention.    Fallou Hulbert LRT/CTRS         Jinger Middlesworth 10/24/2021 12:14 PM

## 2021-10-24 NOTE — Progress Notes (Signed)
NUTRITION ASSESSMENT  Pt identified as at risk on the Malnutrition Screen Tool  INTERVENTION:  -Modify diet order to vegetarian diet -Continue MVI daily -[employer] Standard TID, each supplement provides 455 kcals and 19 grams protein  NUTRITION DIAGNOSIS: Unintentional weight loss related to sub-optimal intake as evidenced by pt report.   Goal: Pt to meet >/= 90% of their estimated nutrition needs.  Monitor:  PO intake  Assessment:   30 yo female admitted after an overdose related to depression and anxiety along with stress of her in-laws living with her. She and her husband were discussing separating prior to her overdose, which triggered this.  "I don't feel anyone cares" as she is seen as the problem in her immediate household and going to live in an apartment.  Her husband does want to support her and for her to be happy per the client. High depression with anhedonia, loss of appetite, helplessness, and worthlessness.  Suicidal ideations on admission yesterday, glad she is alive today.  High anxiety with panic attacks at times, especially on the interstate with avoidance of driving on the freeway.  Denies psychosis, paranoia, homicidal ideations, and substance use.  Positive for anger especially regarding her in-laws, reports being a negative person and wants to be more positive.  Effexor makes her feel "dizzy" and does not want to take this anymore.  Pt admitted with MDD.  Chart reviewed; MD and RN concerned about low iron levels, poor oral intake, and decreased food options due to pt being vegetarian. Will provide pt with vegetarian diet and add plant-based friendly supplement [employer]).   Reviewed wt hx; noted discrepancy in both recent weights, so unsure which wt is accurate. Noted distant history of weight loss.   Labs reviewed: Calcium: 8.5.   30 y.o. female  Height: Ht Readings from Last 1 Encounters:  10/22/21 5\' 2"  (1.575 m)    Weight: Wt Readings from Last 1  Encounters:  10/22/21 58.5 kg    Weight Hx: Wt Readings from Last 10 Encounters:  10/22/21 58.5 kg  10/21/21 68 kg  07/19/17 74.8 kg  04/30/17 67.9 kg    BMI:  Body mass index is 23.59 kg/m. BMI WDL.   Estimated Nutritional Needs: Kcal: 25-30 kcal/kg Protein: > 1 gram protein/kg Fluid: 1 ml/kcal  Diet Order:  Diet Order             Diet vegetarian Room service appropriate? Yes; Fluid consistency: Thin  Diet effective now                  Pt is also offered choice of unit snacks mid-morning and mid-afternoon.  Pt is eating as desired.   Lab results and medications reviewed.   Loistine Chance, RD, LDN, [place] Registered Dietitian II Certified Diabetes Care and Education Specialist Please refer to [facility] for RD and/or RD on-call/weekend/after hours pager

## 2021-10-24 NOTE — BHH Counselor (Signed)
CSW spoke with the provider selected by the patient on 10/23/2021.  She indicates that the patient would be a good fit for her services.   Patient has a scheduled consultation for 10/25/2021 at 1:00PM.  Patient provided consent for this CSW to send clinical information for this provider to assess patient further for appropriateness for her practice.  Assunta Curtis, MSW, LCSW 10/24/2021 3:18 PM

## 2021-10-24 NOTE — Progress Notes (Signed)
Pt in bed at the beginning of the shift. She did not want to get up and complained of feeling tired with difficulty sleeping. Affect sad and pt has low energy. In 1:1 pt stated she is here for taking an overdose of Effexor. She reports feelings of depression, sadness and not "feeling like herself". 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 She states she thinks part of her problem is she does not have any hobbies. She is married with one child. She said her husband is supportive. She denies any family history of depression. She denies SI/HI and AVH. She was able to contract for safety and she would reach out to staff is she feels unsafe or wanted hurting herself. Pt refused her am medication at first, because she said it made her sleepy. I told her at this point she is on a very low dose and then she took it. Pt isolates to room and in bed, no interaction with peers and staff. She is a vegetarian and I recommended she has a diet consult, because some of her labs show some low values, such as iron levels.

## 2021-10-24 NOTE — Plan of Care (Signed)
°  Problem: Education: Goal: Emotional status will improve Outcome: Progressing   Problem: Coping: Goal: Ability to verbalize frustrations and anger appropriately will improve Outcome: Progressing Goal: Ability to demonstrate self-control will improve Outcome: Progressing

## 2021-10-25 DIAGNOSIS — F332 Major depressive disorder, recurrent severe without psychotic features: Principal | ICD-10-CM

## 2021-10-25 MED ORDER — ESCITALOPRAM OXALATE 5 MG PO TABS: 5.0000 mg | ORAL_TABLET | Freq: Every day | ORAL | 0 refills | Status: DC

## 2021-10-25 NOTE — Progress Notes (Signed)
Patient is active on the unit and gets along well with others.  Watching tv and socializing she appears to be enjoying her stay here.  She had no qhs meds to take, but could benefit from having a sleep aid as she reports not sleeping at all the night before. She denies si  hi  avh anxiety and pain at this encounter.  Encouraged her to seek staff with any concerns.  Will continue  to monitor with q15 minute safety checks.    Cleo Butler-Nicholson, LPN

## 2021-10-25 NOTE — Discharge Summary (Signed)
Physician Discharge Summary Note  Patient:  Isabella Bennett is an 30 y.o., female MRN:  [id number] DOB:  [birth year] Patient phone:  [phone] (home)  Patient address:   [address] [place] [address],  Total Time spent with patient: 45 minutes  Date of Admission:  10/22/2021 Date of Discharge: 10/25/2021  Reason for Admission: depression, s/p suicide attempt  Principal Problem: Major depressive disorder, recurrent severe without psychotic features [facility]) Discharge Diagnoses: Principal Problem:   Major depressive disorder, recurrent severe without psychotic features ([place])   Past Psychiatric History:  depression, anxiety. No medications.  Past Medical History:  Past Medical History:  Diagnosis Date   Medical history non-contributory     Past Surgical History:  Procedure Laterality Date   CESAREAN SECTION  07/21/2017   Procedure: CESAREAN SECTION;  Surgeon: Schermerhorn, Gwen Her, MD;  Location: [facility] ORS;  Service: Obstetrics;;   Family History: History reviewed. No pertinent family history. Family Psychiatric  History: unknown Social History:  Social History   Substance and Sexual Activity  Alcohol Use No     Social History   Substance and Sexual Activity  Drug Use No    Social History   Socioeconomic History   Marital status: Married    Spouse name: Not on file   Number of children: Not on file   Years of education: Not on file   Highest education level: Not on file  Occupational History   Not on file  Tobacco Use   Smoking status: Never    Passive exposure: Never   Smokeless tobacco: Never  Substance and Sexual Activity   Alcohol use: No   Drug use: No   Sexual activity: Yes  Other Topics Concern   Not on file  Social History Narrative   Not on file   Social Determinants of Health   Financial Resource Strain: Not on file  Food Insecurity: Not on file  Transportation Needs: Not on file  Physical Activity: Not on file  Stress: Not on file   Social Connections: Not on file    Hospital Course:    The patient was admitted to Adult Psychiatry on a voluntary basis. She was restricted to ward and placed on suicide precautions. Patient was introduced to milieu activities and encouraged to participate in psycho-social groups. The plan at the time of admission was safety, stabilization and treatment.  For the management of depressive disorder, patient was started on Escitalopram (Lexapro) at 5 mg p.o. daily. Patient never required as needed medications for agitation or psychosis. She participated in groups and socialized with few peers. She never required seclusion or restraints. Her initial symptoms of depression had improved during the course of hospitalization.  She reports feeling much better, reports being in good mood, denies any thoughts of harming self, denies thoughts of harming others. Denies any physical complaints. She reports he can contract for safety if discharged home.   On the day of discharge 10/25/21, the patient was considered an acute LOW risk of self harm despite recent overdose. Patients recent suicide attempt represents non-modifiable/baseline risk factors. Presence of mental disorder (depression) is dynamic risk factor. The patient denies suicidal thoughts, denies access to firearm. Patient is future-oriented, has responsibilities, help-seeking, has access to mental health care, has family and community support, has cultural/religious beliefs that discourage suicide - all protective factors. Therefore, represents a low risk for harming self acutely and elevated chronic risk due to non-modifiable risk factors.  [place] Suicide Severity Rating Scale Wish to be dead:  No Suicidal thoughts: No                 Suicidal thoughts with method: No                 Suicidal intent: No                 Suicide intent with specific plan: No                 Suicide behavior: No   Physical Findings: AIMS: Facial and Oral  Movements Muscles of Facial Expression: None, normal Lips and Perioral Area: None, normal Jaw: None, normal Tongue: None, normal,Extremity Movements Upper (arms, wrists, hands, fingers): None, normal Lower (legs, knees, ankles, toes): None, normal, Trunk Movements Neck, shoulders, hips: None, normal, Overall Severity Severity of abnormal movements (highest score from questions above): None, normal Incapacitation due to abnormal movements: None, normal [name] awareness of abnormal movements (rate only patient's report): No Awareness, Dental Status Current problems with teeth and/or dentures?: No Does patient usually wear dentures?: No  CIWA:    COWS:      Psychiatric Specialty Exam:  Appearance:  [name], appearing stated age, appears well-nourished;  wearing appropriate to the weather and situation casual clothes, with fair grooming and hygiene. Normal level of alertness and appropriate facial expression.  Attitude/Behavior: calm, cooperative, engaging with appropriate eye contact.  Motor: WNL; dyskinesias not evident. Gait appears in full range.  Speech: spontaneous, clear, coherent, normal comprehension.  Mood: euthymic, " good ".  Affect: appropriately-reactive, full range.  Thought process: patient appears coherent, organized, logical, goal-directed, associations are appropriate.  Thought content: patient denies suicidal thoughts, denies homicidal thoughts; did not express any delusions.  Thought perception: patient denies auditory and visual hallucinations, no illusions, no depersonalizations. Did not appear internally stimulated.  Cognition: patient is alert and oriented in self, place, date; with intact abstract, fund of knowledge, attention and concentration.  Insight: good, in regards of understanding of presence, nature, cause, and significance of mental or emotional problem.  Judgement: good, in regards of ability to make good decisions concerning the  appropriate thing to do in various situations, including ability to form opinions regarding their mental health condition.   Physical Exam: Physical Exam ROS Blood pressure 105/61, pulse 72, temperature 98.3 F (36.8 C), temperature source Oral, resp. rate 18, height 5\' 2"  (1.575 m), weight 58.5 kg, last menstrual period 10/21/2021, SpO2 100 %, unknown if currently breastfeeding. Body mass index is 23.59 kg/m.   Social History   Tobacco Use  Smoking Status Never   Passive exposure: Never  Smokeless Tobacco Never   Tobacco Cessation:  N/A, patient does not currently use tobacco products   Blood Alcohol level:  Lab Results  Component Value Date   ETH <10 16/08/9603    Metabolic Disorder Labs:  No results found for: HGBA1C, MPG No results found for: PROLACTIN No results found for: CHOL, TRIG, HDL, CHOLHDL, VLDL, LDLCALC  See Psychiatric Specialty Exam and Suicide Risk Assessment completed by Attending Physician prior to discharge.  Discharge destination:  Home  Is patient on multiple antipsychotic therapies at discharge:  No   Has Patient had three or more failed trials of antipsychotic monotherapy by history:  No  Recommended Plan for Multiple Antipsychotic Therapies: NA   Allergies as of 10/25/2021   No Known Allergies      Medication List     STOP taking these medications    neomycin-polymyxin b-dexamethasone 3.5-10000-0.1 Susp Commonly known as:  MAXITROL       TAKE these medications      Indication  escitalopram 5 MG tablet Commonly known as: LEXAPRO Take 1 tablet (5 mg total) by mouth daily. Start taking on: October 26, 2021  Indication: Major Depressive Disorder         Follow-up recommendations:  Other:  follow-up with outpatient [place] counselor and psychiatrist.  Comments:  Emergency Contact Plan: Patient understands to call Suicide Hotline at 2, or Mobile Crisis at [phone], call 911, or go to the nearest ER as appropriate in case  of thoughts of harm to self or others, or acute onset of intolerable side effects.   Signed: Larita Fife, MD 10/25/2021, 11:55 AM

## 2021-10-25 NOTE — H&P (Signed)
CSW faxed requested information to Rolly Salter, potential therapist, phone [phone] f [phone].  Pt is to have a consult at 1PM and call the therapist.   Assunta Curtis, MSW, LCSW 10/25/2021 10:14 AM

## 2021-10-25 NOTE — Progress Notes (Signed)
Patient in the day room watching TV. Reports being unable to sleep. Will continue to monitor.   Cleo Virgilio Belling, LPN

## 2021-10-25 NOTE — Plan of Care (Signed)
Pt denies depression, anxiety, SI, HI and AVH, Pt was educated on care plan and verbalizes understanding. Collier Bullock RN Problem: Education: Goal: Emotional status will improve Outcome: Adequate for Discharge   Problem: Coping: Goal: Ability to verbalize frustrations and anger appropriately will improve Outcome: Adequate for Discharge Goal: Ability to demonstrate self-control will improve Outcome: Adequate for Discharge   Problem: Safety: Goal: Periods of time without injury will increase Outcome: Adequate for Discharge

## 2021-10-25 NOTE — Progress Notes (Signed)
Pt is eating breakfast and reports that she is not receiving a proper diet. Pt states "I am a vegetarian I don't eat eggs." When asked if she would prefer vegan options, Pt denies she is vegan and states she drinks milk and uses other animal products, but eggs are excluded. Pt continues to want to complain to provider about food options. Pt is educated that hospital providers do not control the food that is provided in the hospital and only can place diet orders, and food intolerances. Pt requests that she be able to bring food in, Pt is educated that outside meals are not allowed during admission on this unit. Pt is instructed that she will provided with a menu daily and that if she has any questions she can ask staff and we can provide information as needed.

## 2021-10-25 NOTE — Progress Notes (Signed)
Recreation Therapy Notes  Date: 10/25/2021  Time: 10:00 am    Location: Craft room    Behavioral response: Appropriate   Intervention Topic: Self-care   Discussion/Intervention:  Group content today was focused on Self-Care. The group defined self-care and some positive ways they care for themselves. Individuals expressed ways and reasons why they neglected any self-care in the past. Patients described ways to improve self-care in the future. The group explained what could happen if they did not do any self-care activities at all. The group participated in the intervention self-care assessment where they had a chance to discover some of their weaknesses and strengths in self- care. Patient came up with a self-care plan to improve themselves in the future.  Clinical Observations/Feedback: Patient came to group and expressed that working sometimes stops her from participating in self-care. Individual was social with peers and staff while participating in the intervention.    Dim Meisinger LRT/CTRS         Ova Gillentine 10/25/2021 12:04 PM

## 2021-10-25 NOTE — Progress Notes (Signed)
°  [facility] Adult Case Management Discharge Plan :  Will you be returning to the same living situation after discharge:  Yes,  Patient to return to place of residence.  At discharge, do you have transportation home?: Yes,  [name] family to assist with transportation.  Do you have the ability to pay for your medications: Yes,  [place] private insurance.   Release of information consent forms completed and in the chart;  [name] signature needed at discharge.  Patient to Follow up at:  Follow-up Information     [facility] Follow up.   Why: Appointment is scheduled for 11/04/2021 at 12 noon.  Thanks! Contact information: [address] [place], [place] [address] [phone]                Next level of care provider has access to [place] and Suicide Prevention discussed: Yes,  SPE completed with patient and Jiten Shean, counsin.    Has patient been referred to the Quitline?: N/A patient is not a smoker  Patient has been referred for addiction treatment: N/A  Durenda Hurt, LCSWA 10/25/2021, 2:08 PM

## 2021-10-25 NOTE — Progress Notes (Signed)
Pt denies SI, HI and AVH. Pt was educated on dc plan and verbalizes understanding. Pt received belongings, AVS, transition record. Collier Bullock RN

## 2021-10-25 NOTE — Plan of Care (Signed)
°  Problem: Education: Goal: Emotional status will improve Outcome: Progressing   Problem: Coping: Goal: Ability to verbalize frustrations and anger appropriately will improve Outcome: Progressing Goal: Ability to demonstrate self-control will improve Outcome: Progressing   Problem: Safety: Goal: Periods of time without injury will increase Outcome: Progressing

## 2021-10-25 NOTE — BHH Suicide Risk Assessment (Signed)
[facility] Discharge Suicide Risk Assessment   Principal Problem: Major depressive disorder, recurrent severe without psychotic features ([place]) Discharge Diagnoses: Principal Problem:   Major depressive disorder, recurrent severe without psychotic features ([place])   Total Time spent with patient: 45 minutes  Suicide Risk:  Mild:  Suicidal ideation of limited frequency, intensity, duration, and specificity.  There are no identifiable plans, no associated intent, mild dysphoria and related symptoms, good self-control (both objective and subjective assessment), few other risk factors, and identifiable protective factors, including available and accessible social support.  On the day of discharge 10/25/21, the patient was considered an acute LOW risk of self harm despite recent overdose. Patients recent suicide attempt represents non-modifiable/baseline risk factors. Presence of mental disorder (depression) is dynamic risk factor. The patient denies suicidal thoughts, denies access to firearm. Patient is future-oriented, has responsibilities, help-seeking, has access to mental health care, has family and community support, has cultural/religious beliefs that discourage suicide - all protective factors. Therefore, represents a low risk for harming self acutely and elevated chronic risk due to non-modifiable risk factors.  [place] Suicide Severity Rating Scale Wish to be dead: No Suicidal thoughts: No                 Suicidal thoughts with method: No                 Suicidal intent: No                 Suicide intent with specific plan: No                 Suicide behavior: No   Plan Of Care/Follow-up recommendations:  Other:  follow-up with outpatient [facility] clinic  Larita Fife, MD 10/25/2021, 11:55 AM

## 2021-12-20 ENCOUNTER — Ambulatory Visit (INDEPENDENT_AMBULATORY_CARE_PROVIDER_SITE_OTHER): Payer: BLUE CROSS/BLUE SHIELD | Admitting: Nurse Practitioner

## 2021-12-20 ENCOUNTER — Other Ambulatory Visit: Payer: Self-pay

## 2021-12-20 ENCOUNTER — Encounter: Payer: Self-pay | Admitting: Nurse Practitioner

## 2021-12-20 VITALS — BP 122/82 | HR 53 | Temp 98.4°F | Resp 16 | Ht 62.0 in | Wt 136.0 lb

## 2021-12-20 DIAGNOSIS — E039 Hypothyroidism, unspecified: Secondary | ICD-10-CM | POA: Diagnosis not present

## 2021-12-20 DIAGNOSIS — E782 Mixed hyperlipidemia: Secondary | ICD-10-CM

## 2021-12-20 DIAGNOSIS — E538 Deficiency of other specified B group vitamins: Secondary | ICD-10-CM | POA: Diagnosis not present

## 2021-12-20 DIAGNOSIS — D508 Other iron deficiency anemias: Secondary | ICD-10-CM

## 2021-12-20 DIAGNOSIS — F332 Major depressive disorder, recurrent severe without psychotic features: Secondary | ICD-10-CM

## 2021-12-20 DIAGNOSIS — E559 Vitamin D deficiency, unspecified: Secondary | ICD-10-CM

## 2021-12-20 DIAGNOSIS — Z7689 Persons encountering health services in other specified circumstances: Secondary | ICD-10-CM

## 2021-12-20 MED ORDER — CYANOCOBALAMIN 1000 MCG/ML IJ SOLN
1000.0000 ug | Freq: Once | INTRAMUSCULAR | Status: AC
  Administered 2021-12-20: 1000 ug via INTRAMUSCULAR

## 2021-12-20 NOTE — Progress Notes (Signed)
[facility] [place], [place] [address]  Internal MEDICINE  Office Visit Note  Patient Name: Isabella Bennett  [id number]  [id number]  Date of Service: 12/20/2021   Complaints/HPI Pt is here for establishment of PCP. Chief Complaint  Patient presents with   New Patient (Initial Visit)    Discuss low iron, and b12   Depression   HPI Isabella Bennett presents for a new patient visit to establish care. She is a well appearing 31 yo female establishing with a primary care provider due to her previous provider no longer taking her insurance. She was hospitalized in a behavioral health unit for depression in December 2022 and was started on lexapro for depression and anxiety. The psychiatrist that was managing her care in the hospital wanted to increase the lexapro dose prior to her being discharged. She is currently on lexapro 5 mg daily but she wants to stop taking the medication. She states that she feels emotionally numb on the medication and does not feel the emotions she is supposed to feel and she does not feel right on this medication. She previously tried venlafaxine but did not feel right on this medication either. She would prefer to stay off of antidepressants if possible but wants to discuss this with her psychologist at her appointment on Monday next week before deciding to stop the lexapro.  She lives at home with her husband and 68 yo child. She states that she eventually wants to try to have another baby with her husband.  She feels like her vision is blurry sometimes but she was seen by an eye doctor 6 months ago and her vision was normal. The problem usually only happens when she is driving on highways and she wonders if it is related to anxiety.  She had labs drawn in august and December last year. She has been told she has hypothyroidism in the past but is not on any medication for it. Her last TSH was elevated at 4.967.  Her last CBC showed anemia and her iron is  borderline low at 38 and ferritin is significantly low at 6. Her transferrin and TIBC are elevated and B12 is significantly low at 195. She was prescribed an oral iron supplement but it makes her feel nauseated. She has started taking an OTC B12 supplement but has never received any intramuscular injections of cyanocobalamin to treat B12 deficiency.  She started taking a probiotic.  She needs repeat labs. She is overdue for pap smear and annual physical exam. Her blood pressure is normal. Her heart rate is slightly low but she is asymptomatic.       Current Medication: Outpatient Encounter Medications as of 12/20/2021  Medication Sig   ferrous sulfate 325 (65 FE) MG tablet TAKE 1 TABLET BY MOUTH EVERY DAY WITH BREAKFAST   [DISCONTINUED] escitalopram (LEXAPRO) 5 MG tablet Take 5 mg by mouth daily.   [DISCONTINUED] escitalopram (LEXAPRO) 5 MG tablet Take 1 tablet (5 mg total) by mouth daily.   [EXPIRED] cyanocobalamin ((VITAMIN B-12)) injection 1,000 mcg    No facility-administered encounter medications on file as of 12/20/2021.    Surgical History: Past Surgical History:  Procedure Laterality Date   CESAREAN SECTION  07/21/2017   Procedure: CESAREAN SECTION;  Surgeon: Schermerhorn, Gwen Her, MD;  Location: [facility] ORS;  Service: Obstetrics;;    Medical History: Past Medical History:  Diagnosis Date   Depression    Medical history non-contributory     Family History: Family History  Problem  Relation Age of Onset   Liver disease Mother    Kidney Stones Mother    Hypertension Father     Social History   Socioeconomic History   Marital status: Married    Spouse name: Not on file   Number of children: Not on file   Years of education: Not on file   Highest education level: Not on file  Occupational History   Not on file  Tobacco Use   Smoking status: Never    Passive exposure: Never   Smokeless tobacco: Never  Substance and Sexual Activity   Alcohol use: Yes   Drug use: No    Sexual activity: Yes  Other Topics Concern   Not on file  Social History Narrative   Not on file   Social Determinants of Health   Financial Resource Strain: Not on file  Food Insecurity: Not on file  Transportation Needs: Not on file  Physical Activity: Not on file  Stress: Not on file  Social Connections: Not on file  Intimate Partner Violence: Not on file     Review of Systems  Constitutional:  Negative for chills, fatigue and unexpected weight change.  HENT:  Negative for congestion, rhinorrhea, sneezing and sore throat.   Eyes:  Negative for redness.  Respiratory:  Negative for cough, chest tightness, shortness of breath and wheezing.   Cardiovascular: Negative.  Negative for chest pain and palpitations.  Gastrointestinal: Negative.  Negative for abdominal pain, constipation, diarrhea, nausea and vomiting.  Genitourinary:  Negative for dysuria and frequency.  Musculoskeletal: Negative.  Negative for arthralgias, back pain, joint swelling and neck pain.  Skin:  Negative for rash.       Reports having acne, thinks it is related to lexapro.   Neurological: Negative.  Negative for tremors and numbness.  Hematological:  Negative for adenopathy. Does not bruise/bleed easily.  Psychiatric/Behavioral:  Positive for behavioral problems (Depression). Negative for self-injury, sleep disturbance and suicidal ideas. The patient is nervous/anxious.    Vital Signs: BP 122/82    Pulse (!) 53    Temp 98.4 F (36.9 C)    Resp 16    Ht _0  (1.575 m)    Wt 136 lb (61.7 kg)    SpO2 99%    BMI 24.87 kg/m    Physical Exam Vitals reviewed.  Constitutional:      General: She is not in acute distress.    Appearance: Normal appearance. She is normal weight. She is not ill-appearing.  HENT:     Head: Normocephalic and atraumatic.  Eyes:     Pupils: Pupils are equal, round, and reactive to light.  Cardiovascular:     Rate and Rhythm: Normal rate and regular rhythm.  Pulmonary:      Effort: Pulmonary effort is normal. No respiratory distress.  Neurological:     Mental Status: She is alert and oriented to person, place, and time.     Cranial Nerves: No cranial nerve deficit.     Coordination: Coordination normal.     Gait: Gait normal.  Psychiatric:        Mood and Affect: Mood is anxious and depressed.        Speech: Speech normal.        Behavior: Behavior normal. Behavior is cooperative.        Thought Content: Thought content normal. Thought content is not paranoid or delusional. Thought content does not include homicidal or suicidal ideation. Thought content does not include homicidal or suicidal plan.  Cognition and Memory: Cognition normal.      Assessment/Plan: 1. B12 deficiency Significantly low B12 level, B12 injection administered in office today, repeat labs ordered.will follow up in 4 weeks.  - cyanocobalamin ((VITAMIN B-12)) injection 1,000 mcg - CBC with Differential/Platelet - B12 and Folate Panel - CMP14+EGFR  2. Other iron deficiency anemia Patient is anemic, repeat labs ordered, will follow up in 4 weeks.  - CBC with Differential/Platelet - Iron, TIBC and Ferritin Panel - CMP14+EGFR  3. Vitamin D deficiency Routine labs ordered.  - CMP14+EGFR - Vitamin D (25 hydroxy)  4. Mixed hyperlipidemia Routine labs ordered. - CMP14+EGFR - Lipid Profile  5. Hypothyroidism, unspecified type Routine labs ordered - TSH + free T4 - CMP14+EGFR  6. Major depressive disorder, recurrent severe without psychotic features ([place]) Seeing a psychologist for therapy, currently on lexapro but wants to stop taking lexapro and see how she does with no antidepressant medication. She will discuss with her psychologist first. Follow up in 4 weeks.  - CMP14+EGFR  7. Encounter to establish care with new doctor Establishing with new PCP, needs repeat labs, overdue for annual physical exam and pap smear. Will follow up in 4 weeks to discuss lab results, will  also schedule an annual physical exam visit for earliest available opening.     General Counseling: Isabella Bennett verbalizes understanding of the findings of todays visit and agrees with plan of treatment. I have discussed any further diagnostic evaluation that may be needed or ordered today. We also reviewed her medications today. she has been encouraged to call the office with any questions or concerns that should arise related to todays visit.    Counseling:  [place] Controlled Substance Database was reviewed by me.  Orders Placed This Encounter  Procedures   CBC with Differential/Platelet   Iron, TIBC and Ferritin Panel   B12 and Folate Panel   TSH + free T4   CMP14+EGFR   Vitamin D (25 hydroxy)   Lipid Profile    Meds ordered this encounter  Medications   cyanocobalamin ((VITAMIN B-12)) injection 1,000 mcg    Return in about 4 weeks (around 01/17/2022) for F/U, Anxiety/depression, Labs, Legion Discher PCP will also need appt for CPE/PAP early avail. .  Time spent:30 Minutes Time spent with patient included reviewing progress notes, labs, imaging studies, and discussing plan for follow up.    This patient was seen by Jonetta Osgood, FNP-C in collaboration with Dr. Clayborn Bigness as a part of collaborative care agreement.    Talton Delpriore R. Valetta Fuller, MSN, FNP-C Internal Medicine

## 2021-12-21 LAB — IRON,TIBC AND FERRITIN PANEL
Ferritin: 4 ng/mL — ABNORMAL LOW (ref 15–150)
Iron Saturation: 5 % — CL (ref 15–55)
Iron: 25 ug/dL — ABNORMAL LOW (ref 27–159)
Total Iron Binding Capacity: 470 ug/dL — ABNORMAL HIGH (ref 250–450)
UIBC: 445 ug/dL — ABNORMAL HIGH (ref 131–425)

## 2021-12-21 LAB — CBC WITH DIFFERENTIAL/PLATELET
Basophils Absolute: 0 10*3/uL (ref 0.0–0.2)
Basos: 0 %
EOS (ABSOLUTE): 0.1 10*3/uL (ref 0.0–0.4)
Eos: 1 %
Hematocrit: 36.9 % (ref 34.0–46.6)
Hemoglobin: 11.6 g/dL (ref 11.1–15.9)
Immature Grans (Abs): 0 10*3/uL (ref 0.0–0.1)
Immature Granulocytes: 0 %
Lymphocytes Absolute: 1.6 10*3/uL (ref 0.7–3.1)
Lymphs: 40 %
MCH: 23.3 pg — ABNORMAL LOW (ref 26.6–33.0)
MCHC: 31.4 g/dL — ABNORMAL LOW (ref 31.5–35.7)
MCV: 74 fL — ABNORMAL LOW (ref 79–97)
Monocytes Absolute: 0.3 10*3/uL (ref 0.1–0.9)
Monocytes: 7 %
Neutrophils Absolute: 2 10*3/uL (ref 1.4–7.0)
Neutrophils: 52 %
Platelets: 278 10*3/uL (ref 150–450)
RBC: 4.98 x10E6/uL (ref 3.77–5.28)
RDW: 14.3 % (ref 11.7–15.4)
WBC: 3.9 10*3/uL (ref 3.4–10.8)

## 2021-12-21 LAB — B12 AND FOLATE PANEL
Folate: 7.8 ng/mL (ref >3.0)
Vitamin B-12: >2000 pg/mL — ABNORMAL HIGH (ref 232–1245)

## 2021-12-21 LAB — LIPID PANEL
Chol/HDL Ratio: 3 ratio (ref 0.0–4.4)
Cholesterol, Total: 186 mg/dL (ref 100–199)
HDL: 62 mg/dL (ref >39)
LDL Chol Calc (NIH): 109 mg/dL — ABNORMAL HIGH (ref 0–99)
Triglycerides: 84 mg/dL (ref 0–149)
VLDL Cholesterol Cal: 15 mg/dL (ref 5–40)

## 2021-12-21 LAB — CMP14+EGFR
ALT: 11 IU/L (ref 0–32)
AST: 21 IU/L (ref 0–40)
Albumin/Globulin Ratio: 1.3 (ref 1.2–2.2)
Albumin: 4.3 g/dL (ref 3.8–4.8)
Alkaline Phosphatase: 49 IU/L (ref 44–121)
BUN/Creatinine Ratio: 11 (ref 9–23)
BUN: 7 mg/dL (ref 6–20)
Bilirubin Total: <0.2 mg/dL (ref 0.0–1.2)
CO2: 23 mmol/L (ref 20–29)
Calcium: 9.4 mg/dL (ref 8.7–10.2)
Chloride: 104 mmol/L (ref 96–106)
Creatinine, Ser: 0.65 mg/dL (ref 0.57–1.00)
Globulin, Total: 3.2 g/dL (ref 1.5–4.5)
Glucose: 80 mg/dL (ref 70–99)
Potassium: 4.1 mmol/L (ref 3.5–5.2)
Sodium: 137 mmol/L (ref 134–144)
Total Protein: 7.5 g/dL (ref 6.0–8.5)
eGFR: 121 mL/min/{1.73_m2} (ref >59)

## 2021-12-21 LAB — VITAMIN D 25 HYDROXY (VIT D DEFICIENCY, FRACTURES): Vit D, 25-Hydroxy: 19.3 ng/mL — ABNORMAL LOW (ref 30.0–100.0)

## 2021-12-21 LAB — TSH+FREE T4
Free T4: 1.23 ng/dL (ref 0.82–1.77)
TSH: 2.66 u[IU]/mL (ref 0.450–4.500)

## 2021-12-22 NOTE — Progress Notes (Signed)
I have reviewed the lab results. There are some critically abnormal labs mainly related to her iron panel. The patient is currently trying to take her oral iron supplement at night to see if she can tolerate it better. She was initially offered a referral for hematology but declined and wanted to try to tolerate the oral iron supplement. Will discuss with patient at her upcoming office visit the need to see hematology and most likely receive iron infusions instead of oral iron supplements.

## 2022-01-14 ENCOUNTER — Telehealth: Payer: Self-pay

## 2022-01-14 NOTE — Telephone Encounter (Signed)
Sent mychart message to confirm 01/16/22 appointment-Toni ?

## 2022-01-16 ENCOUNTER — Ambulatory Visit (INDEPENDENT_AMBULATORY_CARE_PROVIDER_SITE_OTHER): Payer: BLUE CROSS/BLUE SHIELD | Admitting: Nurse Practitioner

## 2022-01-16 ENCOUNTER — Encounter: Payer: Self-pay | Admitting: Nurse Practitioner

## 2022-01-16 ENCOUNTER — Other Ambulatory Visit: Payer: Self-pay

## 2022-01-16 VITALS — BP 104/56 | HR 65 | Temp 98.6°F | Resp 16 | Ht 62.0 in | Wt 138.4 lb

## 2022-01-16 DIAGNOSIS — R300 Dysuria: Secondary | ICD-10-CM

## 2022-01-16 DIAGNOSIS — E559 Vitamin D deficiency, unspecified: Secondary | ICD-10-CM

## 2022-01-16 DIAGNOSIS — E7800 Pure hypercholesterolemia, unspecified: Secondary | ICD-10-CM | POA: Diagnosis not present

## 2022-01-16 DIAGNOSIS — Z113 Encounter for screening for infections with a predominantly sexual mode of transmission: Secondary | ICD-10-CM

## 2022-01-16 DIAGNOSIS — Z0001 Encounter for general adult medical examination with abnormal findings: Secondary | ICD-10-CM | POA: Diagnosis not present

## 2022-01-16 DIAGNOSIS — D508 Other iron deficiency anemias: Secondary | ICD-10-CM

## 2022-01-16 DIAGNOSIS — E538 Deficiency of other specified B group vitamins: Secondary | ICD-10-CM | POA: Diagnosis not present

## 2022-01-16 DIAGNOSIS — Z124 Encounter for screening for malignant neoplasm of cervix: Secondary | ICD-10-CM

## 2022-01-16 DIAGNOSIS — Z01419 Encounter for gynecological examination (general) (routine) without abnormal findings: Secondary | ICD-10-CM

## 2022-01-16 MED ORDER — FERROUS FUMARATE 325 (106 FE) MG PO TABS: 1.0000 | ORAL_TABLET | Freq: Every day | ORAL | 4 refills | Status: DC

## 2022-01-16 NOTE — Progress Notes (Signed)
[facility] ?[address] ?[place], [place] [address] ? ?Internal MEDICINE  ?Office Visit Note ? ?Patient Name: Isabella Bennett ? [id number]  ?[id number] ? ?Date of Service: 01/16/2022 ? ?Chief Complaint  ?Patient presents with  ? Annual Exam  ?  Discuss meds  ? Depression  ? Anxiety  ? Medication Refill  ? ? ?HPI ?Isabella Bennett presents for an annual well visit and physical exam.  She is a well-appearing 31 year old female with a history of iron deficiency anemia and depression.  She has been taking Lexapro 5 mg daily for depression and she wants to stop taking this medication.  She reports that she still feels like she is numb to her husband emotionally and like the medication is decreasing her libido.  She is due for a Pap smear she has a 81-year-old biological daughter and has never had a Pap smear done in the [place] or back in her home country.  ?Lab results were discussed today with the patient: ?--Her cholesterol levels are normal except for a slightly elevated LDL of  ?-- Her CBC is normal except for a low MCV and MCH although patient has a history of low hemoglobin and hematocrit on previous CBCs. ?-- [name] iron panel is abnormal with an elevated TIBC and elevated UIBC.  Her iron level is low at 25 iron saturation is very low at 5% and ferritin is very low at 4.  ?--B12 level is high and folate is normal at 7.8. ?--Her vitamin D level is low at 19.3. ?-- Thyroid levels are normal ?--Her metabolic panel is normal.  Her liver and kidney function are normal. ? ? ? ?Current Medication: ?Outpatient Encounter Medications as of 01/16/2022  ?Medication Sig  ? ferrous fumarate (HEMOCYTE - 106 MG FE) 325 (106 Fe) MG TABS tablet Take 1 tablet (106 mg of iron total) by mouth daily.  ? [DISCONTINUED] escitalopram (LEXAPRO) 5 MG tablet Take by mouth.  ? [DISCONTINUED] ferrous sulfate 325 (65 FE) MG tablet TAKE 1 TABLET BY MOUTH EVERY DAY WITH BREAKFAST  ? ?No facility-administered encounter medications on file as of  01/16/2022.  ? ? ?Surgical History: ?Past Surgical History:  ?Procedure Laterality Date  ? CESAREAN SECTION  07/21/2017  ? Procedure: CESAREAN SECTION;  Surgeon: Schermerhorn, Gwen Her, MD;  Location: [facility] ORS;  Service: Obstetrics;;  ? ? ?Medical History: ?Past Medical History:  ?Diagnosis Date  ? Depression   ? Medical history non-contributory   ? ? ?Family History: ?Family History  ?Problem Relation Age of Onset  ? Liver disease Mother   ? Kidney Stones Mother   ? Hypertension Father   ? ? ?Social History  ? ?Socioeconomic History  ? Marital status: Married  ?  Spouse name: Not on file  ? Number of children: Not on file  ? Years of education: Not on file  ? Highest education level: Not on file  ?Occupational History  ? Not on file  ?Tobacco Use  ? Smoking status: Never  ?  Passive exposure: Never  ? Smokeless tobacco: Never  ?Substance and Sexual Activity  ? Alcohol use: Yes  ? Drug use: No  ? Sexual activity: Yes  ?Other Topics Concern  ? Not on file  ?Social History Narrative  ? Not on file  ? ?Social Determinants of Health  ? ?Financial Resource Strain: Not on file  ?Food Insecurity: Not on file  ?Transportation Needs: Not on file  ?Physical Activity: Not on file  ?Stress: Not on file  ?Social Connections: Not  on file  ?Intimate Partner Violence: Not on file  ? ? ? ? ?Review of Systems  ?Constitutional:  Negative for activity change, appetite change, chills, fatigue, fever and unexpected weight change.  ?HENT: Negative.  Negative for congestion, ear pain, rhinorrhea, sore throat and trouble swallowing.   ?Eyes: Negative.   ?Respiratory: Negative.  Negative for cough, chest tightness, shortness of breath and wheezing.   ?Cardiovascular: Negative.  Negative for chest pain and palpitations.  ?Gastrointestinal: Negative.  Negative for abdominal pain, blood in stool, constipation, diarrhea, nausea and vomiting.  ?Endocrine: Negative.   ?Genitourinary: Negative.  Negative for difficulty urinating, dysuria, frequency,  hematuria and urgency.  ?Musculoskeletal: Negative.  Negative for arthralgias, back pain, joint swelling, myalgias and neck pain.  ?Skin: Negative.  Negative for rash and wound.  ?Allergic/Immunologic: Negative.  Negative for immunocompromised state.  ?Neurological: Negative.  Negative for dizziness, seizures, numbness and headaches.  ?Hematological: Negative.   ?Psychiatric/Behavioral: Negative.  Negative for behavioral problems, self-injury and suicidal ideas. The patient is not nervous/anxious.   ? ?Vital Signs: ?BP (!) 104/56   Pulse 65   Temp 98.6 ?F (37 ?C)   Resp 16   Ht '5\' 2"'$  (1.575 m)   Wt 138 lb 6.4 oz (62.8 kg)   SpO2 98%   BMI 25.31 kg/m?  ? ? ?Physical Exam ?Vitals reviewed.  ?Constitutional:   ?   General: She is awake. She is not in acute distress. ?   Appearance: Normal appearance. She is well-developed, well-groomed and overweight. She is not ill-appearing or diaphoretic.  ?HENT:  ?   Head: Normocephalic and atraumatic.  ?   Right Ear: Tympanic membrane, ear canal and external ear normal.  ?   Left Ear: Tympanic membrane, ear canal and external ear normal.  ?   Nose: Nose normal. No congestion or rhinorrhea.  ?   Mouth/Throat:  ?   Lips: Pink.  ?   Mouth: Mucous membranes are moist.  ?   Pharynx: Oropharynx is clear. Uvula midline. No oropharyngeal exudate or posterior oropharyngeal erythema.  ?Eyes:  ?   General: Lids are normal. Vision grossly intact. Gaze aligned appropriately. No scleral icterus.    ?   Right eye: No discharge.     ?   Left eye: No discharge.  ?   Extraocular Movements: Extraocular movements intact.  ?   Conjunctiva/sclera: Conjunctivae normal.  ?   Pupils: Pupils are equal, round, and reactive to light.  ?   Funduscopic exam: ?   Right eye: Red reflex present.     ?   Left eye: Red reflex present. ?Neck:  ?   Thyroid: No thyromegaly.  ?   Vascular: No JVD.  ?   Trachea: Trachea and phonation normal. No tracheal deviation.  ?Cardiovascular:  ?   Rate and Rhythm: Normal  rate and regular rhythm.  ?   Pulses: Normal pulses.  ?   Heart sounds: Normal heart sounds, S1 normal and S2 normal. No murmur heard. ?  No friction rub. No gallop.  ?Pulmonary:  ?   Effort: Pulmonary effort is normal. No accessory muscle usage or respiratory distress.  ?   Breath sounds: Normal breath sounds and air entry. No stridor. No wheezing or rales.  ?Chest:  ?   Chest wall: No tenderness.  ?Breasts: ?   Right: Normal. No swelling, bleeding, inverted nipple, mass, nipple discharge, skin change or tenderness.  ?   Left: Normal. No swelling, bleeding, inverted nipple, mass, nipple discharge, skin  change or tenderness.  ?Abdominal:  ?   General: Bowel sounds are normal. There is no distension.  ?   Palpations: Abdomen is soft. There is no shifting dullness, fluid wave, mass or pulsatile mass.  ?   Tenderness: There is no abdominal tenderness. There is no guarding or rebound.  ?   Hernia: There is no hernia in the left inguinal area or right inguinal area.  ?Genitourinary: ?   General: Normal vulva.  ?   Exam position: Lithotomy position.  ?   Pubic Area: No rash or pubic lice.   ?   Labia:     ?   Right: No rash, tenderness, lesion or injury.     ?   Left: No rash, tenderness, lesion or injury.   ?   Urethra: No prolapse, urethral swelling or urethral lesion.  ?   Vagina: Normal. No signs of injury and foreign body. No vaginal discharge, erythema, tenderness, bleeding, lesions or prolapsed vaginal walls.  ?   Cervix: No cervical motion tenderness, discharge, friability, lesion, erythema, cervical bleeding or eversion.  ?   Uterus: Normal. Not deviated, not fixed and no uterine prolapse.   ?   Adnexa: Right adnexa normal and left adnexa normal.    ?   Right: No mass, tenderness or fullness.      ?   Left: No mass, tenderness or fullness.    ?   Rectum: Normal. No anal fissure or external hemorrhoid.  ?Musculoskeletal:     ?   General: No tenderness or deformity. Normal range of motion.  ?   Cervical back:  Normal range of motion and neck supple.  ?   Right lower leg: No edema.  ?   Left lower leg: No edema.  ?Lymphadenopathy:  ?   Cervical: No cervical adenopathy.  ?   Upper Body:  ?   Right upper body: No supraclavicular, ax

## 2022-01-19 LAB — NUSWAB VAGINITIS PLUS (VG+)
Candida albicans, NAA: NEGATIVE
Candida glabrata, NAA: NEGATIVE
Chlamydia trachomatis, NAA: NEGATIVE
Neisseria gonorrhoeae, NAA: NEGATIVE
Trich vag by NAA: NEGATIVE

## 2022-01-20 LAB — MICROSCOPIC EXAMINATION
Casts: NONE SEEN /lpf
Epithelial Cells (non renal): >10 /hpf — AB (ref 0–10)

## 2022-01-20 LAB — UA/M W/RFLX CULTURE, ROUTINE
Bilirubin, UA: NEGATIVE
Glucose, UA: NEGATIVE
Ketones, UA: NEGATIVE
Nitrite, UA: NEGATIVE
Protein,UA: NEGATIVE
RBC, UA: NEGATIVE
Specific Gravity, UA: 1.01 (ref 1.005–1.030)
Urobilinogen, Ur: 0.2 mg/dL (ref 0.2–1.0)
pH, UA: 7 (ref 5.0–7.5)

## 2022-01-20 LAB — URINE CULTURE, REFLEX

## 2022-01-21 LAB — IGP, APTIMA HPV: HPV Aptima: NEGATIVE

## 2022-01-24 NOTE — Progress Notes (Signed)
Please call the patient and let her know that her pap was negative for intraepithelial lesion and/or malignancy and HPV testing was negative as well. Her Nuswab was negative for sexually transmitted infections, bacterial vaginosis and yeast. Pap smear will be repeated in 3-5 years.

## 2022-01-27 ENCOUNTER — Telehealth: Payer: Self-pay

## 2022-01-27 NOTE — Telephone Encounter (Signed)
Unable to [facility], need to review results with pt  ?

## 2022-01-27 NOTE — Telephone Encounter (Signed)
-----   Message from Jonetta Osgood, NP sent at 01/24/2022  2:34 PM EDT ----- ?Please call the patient and let her know that her pap was negative for intraepithelial lesion and/or malignancy and HPV testing was negative as well. Her Nuswab was negative for sexually transmitted infections, bacterial vaginosis and yeast. Pap smear will be repeated in 3-5 years.  ? ?

## 2022-01-29 ENCOUNTER — Telehealth: Payer: Self-pay

## 2022-01-29 NOTE — Progress Notes (Signed)
LMOM pap was negative and to repeat in 3 to 5 years  ?

## 2022-01-29 NOTE — Telephone Encounter (Signed)
LMOM pap was negative and to repeat in 3 to 5 years  ?

## 2022-01-29 NOTE — Telephone Encounter (Signed)
-----   Message from Jonetta Osgood, NP sent at 01/24/2022  2:34 PM EDT ----- ?Please call the patient and let her know that her pap was negative for intraepithelial lesion and/or malignancy and HPV testing was negative as well. Her Nuswab was negative for sexually transmitted infections, bacterial vaginosis and yeast. Pap smear will be repeated in 3-5 years.  ? ?

## 2022-01-31 ENCOUNTER — Encounter: Payer: Self-pay | Admitting: Nurse Practitioner

## 2022-03-20 ENCOUNTER — Encounter: Payer: Self-pay | Admitting: Nurse Practitioner

## 2022-03-20 ENCOUNTER — Ambulatory Visit (INDEPENDENT_AMBULATORY_CARE_PROVIDER_SITE_OTHER): Payer: BLUE CROSS/BLUE SHIELD | Admitting: Nurse Practitioner

## 2022-03-20 VITALS — BP 111/67 | HR 61 | Temp 98.0°F | Resp 16 | Ht 62.0 in | Wt 137.4 lb

## 2022-03-20 DIAGNOSIS — E538 Deficiency of other specified B group vitamins: Secondary | ICD-10-CM

## 2022-03-20 DIAGNOSIS — F325 Major depressive disorder, single episode, in full remission: Secondary | ICD-10-CM | POA: Diagnosis not present

## 2022-03-20 DIAGNOSIS — D508 Other iron deficiency anemias: Secondary | ICD-10-CM

## 2022-03-20 LAB — CBC WITH DIFFERENTIAL/PLATELET
Basophils Absolute: 0 10*3/uL (ref 0.0–0.2)
Basos: 0 %
EOS (ABSOLUTE): 0.1 10*3/uL (ref 0.0–0.4)
Eos: 1 %
Hematocrit: 37.2 % (ref 34.0–46.6)
Hemoglobin: 12.5 g/dL (ref 11.1–15.9)
Immature Grans (Abs): 0 10*3/uL (ref 0.0–0.1)
Immature Granulocytes: 0 %
Lymphocytes Absolute: 1.4 10*3/uL (ref 0.7–3.1)
Lymphs: 31 %
MCH: 25.4 pg — ABNORMAL LOW (ref 26.6–33.0)
MCHC: 33.6 g/dL (ref 31.5–35.7)
MCV: 76 fL — ABNORMAL LOW (ref 79–97)
Monocytes Absolute: 0.3 10*3/uL (ref 0.1–0.9)
Monocytes: 7 %
Neutrophils Absolute: 2.6 10*3/uL (ref 1.4–7.0)
Neutrophils: 61 %
Platelets: 233 10*3/uL (ref 150–450)
RBC: 4.93 x10E6/uL (ref 3.77–5.28)
RDW: 15.3 % (ref 11.7–15.4)
WBC: 4.4 10*3/uL (ref 3.4–10.8)

## 2022-03-20 LAB — B12 AND FOLATE PANEL
Folate: 8.9 ng/mL (ref >3.0)
Vitamin B-12: 866 pg/mL (ref 232–1245)

## 2022-03-20 LAB — IRON,TIBC AND FERRITIN PANEL
Ferritin: 29 ng/mL (ref 15–150)
Iron Saturation: 39 % (ref 15–55)
Iron: 144 ug/dL (ref 27–159)
Total Iron Binding Capacity: 369 ug/dL (ref 250–450)
UIBC: 225 ug/dL (ref 131–425)

## 2022-03-20 MED ORDER — FERROUS FUMARATE 325 (106 FE) MG PO TABS: 1.0000 | ORAL_TABLET | Freq: Every day | ORAL | 4 refills | Status: DC

## 2022-03-20 NOTE — Progress Notes (Signed)
[facility] [place], [place] [address]  Internal MEDICINE  Office Visit Note  Patient Name: Isabella Bennett  [id number]  [id number]  Date of Service: 03/20/2022  Chief Complaint  Patient presents with   Follow-up   Depression    HPI Isabella Bennett presents for follow-up visit for iron deficiency, B12 deficiency, fatigue, and depression.  At her previous office visit her iron prescription was changed to iron fumarate because there is more elemental iron available in this type of iron versus using iron sulfate.  She had her labs repeated and her iron panel is significantly improved with all levels normal, CBC has improved as well and her B12 and folate levels are normal. Patient also reports she finished weaning herself off of her antidepressant medication and her mood is stable and she is feeling well per her report. She reports that she used to be able to sleep very deep for well over 8 hours and is now waking up after 7 or 8 hours but feeling rested and like she has energy.  She reports that she goes to bed around midnight and wake up at 7 or 8 in the morning.  She also reports that she has been in a better and more stable mood and this has been a positive change affecting her relationship with her husband. She initially wanted to talk about trying to conceive to have another child when talking at a previous office visit but states that they are not planning to have another child at this time.    Current Medication: Outpatient Encounter Medications as of 03/20/2022  Medication Sig   [DISCONTINUED] ferrous fumarate (HEMOCYTE - 106 MG FE) 325 (106 Fe) MG TABS tablet Take 1 tablet (106 mg of iron total) by mouth daily.   ferrous fumarate (HEMOCYTE - 106 MG FE) 325 (106 Fe) MG TABS tablet Take 1 tablet (106 mg of iron total) by mouth daily.   No facility-administered encounter medications on file as of 03/20/2022.    Surgical History: Past Surgical History:  Procedure  Laterality Date   CESAREAN SECTION  07/21/2017   Procedure: CESAREAN SECTION;  Surgeon: Schermerhorn, Gwen Her, MD;  Location: [facility] ORS;  Service: Obstetrics;;    Medical History: Past Medical History:  Diagnosis Date   Depression    Medical history non-contributory     Family History: Family History  Problem Relation Age of Onset   Liver disease Mother    Kidney Stones Mother    Hypertension Father     Social History   Socioeconomic History   Marital status: Married    Spouse name: Not on file   Number of children: Not on file   Years of education: Not on file   Highest education level: Not on file  Occupational History   Not on file  Tobacco Use   Smoking status: Never    Passive exposure: Never   Smokeless tobacco: Never  Substance and Sexual Activity   Alcohol use: Yes   Drug use: No   Sexual activity: Yes  Other Topics Concern   Not on file  Social History Narrative   Not on file   Social Determinants of Health   Financial Resource Strain: Not on file  Food Insecurity: Not on file  Transportation Needs: Not on file  Physical Activity: Not on file  Stress: Not on file  Social Connections: Not on file  Intimate Partner Violence: Not on file      Review of Systems  Constitutional:  Negative for chills, fatigue and unexpected weight change.  HENT:  Negative for congestion, rhinorrhea, sneezing and sore throat.   Eyes:  Negative for redness.  Respiratory:  Negative for cough, chest tightness and shortness of breath.   Cardiovascular:  Negative for chest pain and palpitations.  Gastrointestinal:  Negative for abdominal pain, constipation, diarrhea, nausea and vomiting.  Genitourinary:  Negative for dysuria and frequency.  Musculoskeletal:  Negative for arthralgias, back pain, joint swelling and neck pain.  Skin:  Negative for rash.  Neurological: Negative.  Negative for tremors and numbness.  Hematological:  Negative for adenopathy. Does not  bruise/bleed easily.  Psychiatric/Behavioral:  Negative for behavioral problems (Depression), sleep disturbance and suicidal ideas. The patient is not nervous/anxious.    Vital Signs: BP 111/67   Pulse 61   Temp 98 F (36.7 C)   Resp 16   Ht '5\' 2"'$  (1.575 m)   Wt 137 lb 6.4 oz (62.3 kg)   SpO2 99%   BMI 25.13 kg/m    Physical Exam Vitals reviewed.  Constitutional:      General: She is not in acute distress.    Appearance: Normal appearance. She is normal weight. She is not ill-appearing.  HENT:     Head: Normocephalic and atraumatic.  Eyes:     Pupils: Pupils are equal, round, and reactive to light.  Cardiovascular:     Rate and Rhythm: Normal rate and regular rhythm.  Pulmonary:     Effort: Pulmonary effort is normal. No respiratory distress.  Neurological:     Mental Status: She is alert and oriented to person, place, and time.  Psychiatric:        Mood and Affect: Mood normal.        Behavior: Behavior normal.       Assessment/Plan: 1. Other iron deficiency anemia Patient may decrease frequency of medication to taking the iron medication once every other day or every Monday Wednesday Friday which ever is easier for her to remember.  Repeat labs ordered, follow-up in 3 months to reassess iron panel B12, folate and CBC.  Patient knows to have labs done prior to next office visit - ferrous fumarate (HEMOCYTE - 106 MG FE) 325 (106 Fe) MG TABS tablet; Take 1 tablet (106 mg of iron total) by mouth daily.  Dispense: 30 tablet; Refill: 4 - Iron, TIBC and Ferritin Panel - B12 and Folate Panel - CBC with Differential/Platelet  2. B12 deficiency Her B12 level is normal  3. Major depressive disorder with single episode, in full remission ([place]) Problem is currently resolved for now, patient's mood is stable she is off of the antidepressant medication, will monitor periodically at next office visit in 3 months.   General Counseling: [name] verbalizes understanding of the  findings of todays visit and agrees with plan of treatment. I have discussed any further diagnostic evaluation that may be needed or ordered today. We also reviewed her medications today. she has been encouraged to call the office with any questions or concerns that should arise related to todays visit.    Orders Placed This Encounter  Procedures   Iron, TIBC and Ferritin Panel   B12 and Folate Panel   CBC with Differential/Platelet    Meds ordered this encounter  Medications   ferrous fumarate (HEMOCYTE - 106 MG FE) 325 (106 Fe) MG TABS tablet    Sig: Take 1 tablet (106 mg of iron total) by mouth daily.    Dispense:  30 tablet  Refill:  4    Return in about 3 months (around 06/20/2022) for F/U, Labs, [place] PCP.   Total time spent:30 Minutes Time spent includes review of chart, medications, test results, and follow up plan with the patient.    Controlled Substance Database was reviewed by me.  This patient was seen by Jonetta Osgood, FNP-C in collaboration with Dr. Clayborn Bigness as a part of collaborative care agreement.   Aidian Salomon R. Valetta Fuller, MSN, FNP-C Internal medicine

## 2022-06-19 ENCOUNTER — Ambulatory Visit: Payer: BLUE CROSS/BLUE SHIELD | Admitting: Nurse Practitioner

## 2023-01-20 ENCOUNTER — Ambulatory Visit (INDEPENDENT_AMBULATORY_CARE_PROVIDER_SITE_OTHER): Payer: BLUE CROSS/BLUE SHIELD | Admitting: Nurse Practitioner

## 2023-01-20 ENCOUNTER — Encounter: Payer: Self-pay | Admitting: Nurse Practitioner

## 2023-01-20 VITALS — BP 103/44 | HR 70 | Temp 97.8°F | Resp 16 | Ht 62.0 in | Wt 135.0 lb

## 2023-01-20 DIAGNOSIS — E559 Vitamin D deficiency, unspecified: Secondary | ICD-10-CM

## 2023-01-20 DIAGNOSIS — E782 Mixed hyperlipidemia: Secondary | ICD-10-CM

## 2023-01-20 DIAGNOSIS — R638 Other symptoms and signs concerning food and fluid intake: Secondary | ICD-10-CM

## 2023-01-20 DIAGNOSIS — Z0001 Encounter for general adult medical examination with abnormal findings: Secondary | ICD-10-CM

## 2023-01-20 DIAGNOSIS — R300 Dysuria: Secondary | ICD-10-CM

## 2023-01-20 DIAGNOSIS — E538 Deficiency of other specified B group vitamins: Secondary | ICD-10-CM

## 2023-01-20 DIAGNOSIS — D508 Other iron deficiency anemias: Secondary | ICD-10-CM | POA: Diagnosis not present

## 2023-01-20 NOTE — Progress Notes (Signed)
[facility] [place], [place] [address]  Internal MEDICINE  Office Visit Note  Patient Name: Lotoya Treglia  [id number]  [id number]  Date of Service: 01/20/2023  Chief Complaint  Patient presents with   Annual Exam   Depression    HPI Kyndell presents for an annual well visit and physical exam.  Well-appearing 32 y.o. female with a history of iron deficiency anemia Pap smear: done in march last year.  Labs: routine labs ordered New or worsening pain: none  Other concerns: has been craving salt lately.     Current Medication: Outpatient Encounter Medications as of 01/20/2023  Medication Sig   ferrous fumarate (HEMOCYTE - 106 MG FE) 325 (106 Fe) MG TABS tablet Take 1 tablet (106 mg of iron total) by mouth daily.   No facility-administered encounter medications on file as of 01/20/2023.    Surgical History: Past Surgical History:  Procedure Laterality Date   CESAREAN SECTION  07/21/2017   Procedure: CESAREAN SECTION;  Surgeon: Schermerhorn, Gwen Her, MD;  Location: [facility] ORS;  Service: Obstetrics;;    Medical History: Past Medical History:  Diagnosis Date   Depression    Medical history non-contributory     Family History: Family History  Problem Relation Age of Onset   Liver disease Mother    Kidney Stones Mother    Hypertension Father     Social History   Socioeconomic History   Marital status: Married    Spouse name: Not on file   Number of children: Not on file   Years of education: Not on file   Highest education level: Not on file  Occupational History   Not on file  Tobacco Use   Smoking status: Never    Passive exposure: Never   Smokeless tobacco: Never  Substance and Sexual Activity   Alcohol use: Not Currently   Drug use: No   Sexual activity: Yes  Other Topics Concern   Not on file  Social History Narrative   Not on file   Social Determinants of Health   Financial Resource Strain: Not on file  Food Insecurity:  Not on file  Transportation Needs: Not on file  Physical Activity: Not on file  Stress: Not on file  Social Connections: Not on file  Intimate Partner Violence: Not on file      Review of Systems  Constitutional:  Negative for activity change, appetite change, chills, fatigue, fever and unexpected weight change.  HENT: Negative.  Negative for congestion, ear pain, rhinorrhea, sore throat and trouble swallowing.   Eyes: Negative.   Respiratory: Negative.  Negative for cough, chest tightness, shortness of breath and wheezing.   Cardiovascular: Negative.  Negative for chest pain and palpitations.  Gastrointestinal: Negative.  Negative for abdominal pain, blood in stool, constipation, diarrhea, nausea and vomiting.  Endocrine: Negative.   Genitourinary: Negative.  Negative for difficulty urinating, dysuria, frequency, hematuria and urgency.  Musculoskeletal: Negative.  Negative for arthralgias, back pain, joint swelling, myalgias and neck pain.  Skin: Negative.  Negative for rash and wound.  Allergic/Immunologic: Negative.  Negative for immunocompromised state.  Neurological: Negative.  Negative for dizziness, seizures, numbness and headaches.  Hematological: Negative.   Psychiatric/Behavioral: Negative.  Negative for behavioral problems, self-injury and suicidal ideas. The patient is not nervous/anxious.     Vital Signs: BP (!) 103/44   Pulse 70   Temp 97.8 F (36.6 C)   Resp 16   Ht 5\' 2"  (1.575 m)   Wt 135 lb (  61.2 kg)   SpO2 99%   BMI 24.69 kg/m    Physical Exam Vitals reviewed.  Constitutional:      General: She is awake. She is not in acute distress.    Appearance: Normal appearance. She is well-developed, well-groomed and overweight. She is not ill-appearing or diaphoretic.  HENT:     Head: Normocephalic and atraumatic.     Right Ear: Tympanic membrane, ear canal and external ear normal.     Left Ear: Tympanic membrane, ear canal and external ear normal.     Nose:  Nose normal. No congestion or rhinorrhea.     Mouth/Throat:     Lips: Pink.     Mouth: Mucous membranes are moist.     Pharynx: Oropharynx is clear. Uvula midline. No oropharyngeal exudate or posterior oropharyngeal erythema.  Eyes:     General: Lids are normal. Vision grossly intact. Gaze aligned appropriately. No scleral icterus.       Right eye: No discharge.        Left eye: No discharge.     Extraocular Movements: Extraocular movements intact.     Conjunctiva/sclera: Conjunctivae normal.     Pupils: Pupils are equal, round, and reactive to light.     Funduscopic exam:    Right eye: Red reflex present.        Left eye: Red reflex present. Neck:     Thyroid: No thyromegaly.     Vascular: No JVD.     Trachea: Trachea and phonation normal. No tracheal deviation.  Cardiovascular:     Rate and Rhythm: Normal rate and regular rhythm.     Pulses: Normal pulses.     Heart sounds: Normal heart sounds, S1 normal and S2 normal. No murmur heard.    No friction rub. No gallop.  Pulmonary:     Effort: Pulmonary effort is normal. No accessory muscle usage or respiratory distress.     Breath sounds: Normal breath sounds and air entry. No stridor. No wheezing or rales.  Chest:     Chest wall: No tenderness.  Breasts:    Right: Normal. No swelling, bleeding, inverted nipple, mass, nipple discharge, skin change or tenderness.     Left: Normal. No swelling, bleeding, inverted nipple, mass, nipple discharge, skin change or tenderness.  Abdominal:     General: Bowel sounds are normal. There is no distension.     Palpations: Abdomen is soft. There is no shifting dullness, fluid wave, mass or pulsatile mass.     Tenderness: There is no abdominal tenderness. There is no guarding or rebound.     Hernia: There is no hernia in the left inguinal area or right inguinal area.  Genitourinary:    General: Normal vulva.     Exam position: Lithotomy position.     Pubic Area: No rash or pubic lice.       Labia:        Right: No rash, tenderness, lesion or injury.        Left: No rash, tenderness, lesion or injury.      Urethra: No prolapse, urethral swelling or urethral lesion.     Vagina: Normal. No signs of injury and foreign body. No vaginal discharge, erythema, tenderness, bleeding, lesions or prolapsed vaginal walls.     Cervix: No cervical motion tenderness, discharge, friability, lesion, erythema, cervical bleeding or eversion.     Uterus: Normal. Not deviated, not fixed and no uterine prolapse.      Adnexa: Right adnexa normal and left  adnexa normal.       Right: No mass, tenderness or fullness.         Left: No mass, tenderness or fullness.       Rectum: Normal. No anal fissure or external hemorrhoid.  Musculoskeletal:        General: No tenderness or deformity. Normal range of motion.     Cervical back: Normal range of motion and neck supple.     Right lower leg: No edema.     Left lower leg: No edema.  Lymphadenopathy:     Cervical: No cervical adenopathy.     Upper Body:     Right upper body: No supraclavicular, axillary or pectoral adenopathy.     Left upper body: No supraclavicular, axillary or pectoral adenopathy.     Lower Body: No right inguinal adenopathy. No left inguinal adenopathy.  Skin:    General: Skin is warm and dry.     Capillary Refill: Capillary refill takes less than 2 seconds.     Coloration: Skin is not pale.     Findings: No erythema or rash.  Neurological:     Mental Status: She is alert and oriented to person, place, and time.     Cranial Nerves: No cranial nerve deficit.     Motor: No abnormal muscle tone.     Coordination: Coordination normal.     Gait: Gait normal.     Deep Tendon Reflexes: Reflexes are normal and symmetric.  Psychiatric:        Mood and Affect: Mood and affect normal.        Behavior: Behavior normal. Behavior is cooperative.        Thought Content: Thought content normal.        Judgment: Judgment normal.         Assessment/Plan: 1. Encounter for routine adult health examination with abnormal findings Age-appropriate preventive screenings and vaccinations discussed, annual physical exam completed. Routine labs for health maintenance ordered, see below. PHM updated.  - CBC with Differential/Platelet - CMP14+EGFR - Lipid Profile - B12 and Folate Panel - Iron, TIBC and Ferritin Panel - Vitamin D (25 hydroxy)  2. Salt craving Labs ordered  3. Other iron deficiency anemia Routine labs ordered - CBC with Differential/Platelet - CMP14+EGFR - Lipid Profile - B12 and Folate Panel - Iron, TIBC and Ferritin Panel - Vitamin D (25 hydroxy)  4. Mixed hyperlipidemia Routine labs ordered - CBC with Differential/Platelet - CMP14+EGFR - Lipid Profile - B12 and Folate Panel - Iron, TIBC and Ferritin Panel - Vitamin D (25 hydroxy)  5. Vitamin D deficiency Routine lab ordered - Vitamin D (25 hydroxy)  6. B12 deficiency Routine labs ordered - CBC with Differential/Platelet - CMP14+EGFR - Lipid Profile - B12 and Folate Panel - Iron, TIBC and Ferritin Panel - Vitamin D (25 hydroxy)  7. Dysuria Routine urinalysis done - UA/M w/rflx Culture, Routine     General Counseling: Kennethia verbalizes understanding of the findings of todays visit and agrees with plan of treatment. I have discussed any further diagnostic evaluation that may be needed or ordered today. We also reviewed her medications today. she has been encouraged to call the office with any questions or concerns that should arise related to todays visit.    Orders Placed This Encounter  Procedures   CBC with Differential/Platelet   CMP14+EGFR   Lipid Profile   B12 and Folate Panel   Iron, TIBC and Ferritin Panel   Vitamin D (25 hydroxy)   UA/M  w/rflx Culture, Routine    No orders of the defined types were placed in this encounter.   Return in about 1 year (around 01/20/2024) for CPE, [place] PCP and otherwise as  needed .   Total time spent:30 Minutes Time spent includes review of chart, medications, test results, and follow up plan with the patient.   [place] Controlled Substance Database was reviewed by me.  This patient was seen by Jonetta Osgood, FNP-C in collaboration with Dr. Clayborn Bigness as a part of collaborative care agreement.  Damyen Knoll R. Valetta Fuller, MSN, FNP-C Internal medicine

## 2023-01-21 LAB — UA/M W/RFLX CULTURE, ROUTINE
Bilirubin, UA: NEGATIVE
Glucose, UA: NEGATIVE
Ketones, UA: NEGATIVE
Leukocytes,UA: NEGATIVE
Nitrite, UA: NEGATIVE
Protein,UA: NEGATIVE
RBC, UA: NEGATIVE
Specific Gravity, UA: 1.015 (ref 1.005–1.030)
Urobilinogen, Ur: 0.2 mg/dL (ref 0.2–1.0)
pH, UA: 5.5 (ref 5.0–7.5)

## 2023-01-21 LAB — MICROSCOPIC EXAMINATION
Bacteria, UA: NONE SEEN
Casts: NONE SEEN /lpf
RBC, Urine: NONE SEEN /hpf (ref 0–2)

## 2023-01-22 LAB — CBC WITH DIFFERENTIAL/PLATELET
Basophils Absolute: 0 10*3/uL (ref 0.0–0.2)
Basos: 1 %
EOS (ABSOLUTE): 0.1 10*3/uL (ref 0.0–0.4)
Eos: 1 %
Hematocrit: 29.2 % — ABNORMAL LOW (ref 34.0–46.6)
Hemoglobin: 8.9 g/dL — ABNORMAL LOW (ref 11.1–15.9)
Immature Grans (Abs): 0 10*3/uL (ref 0.0–0.1)
Immature Granulocytes: 0 %
Lymphocytes Absolute: 1.6 10*3/uL (ref 0.7–3.1)
Lymphs: 42 %
MCH: 21.1 pg — ABNORMAL LOW (ref 26.6–33.0)
MCHC: 30.5 g/dL — ABNORMAL LOW (ref 31.5–35.7)
MCV: 69 fL — ABNORMAL LOW (ref 79–97)
Monocytes Absolute: 0.2 10*3/uL (ref 0.1–0.9)
Monocytes: 7 %
Neutrophils Absolute: 1.8 10*3/uL (ref 1.4–7.0)
Neutrophils: 49 %
Platelets: 254 10*3/uL (ref 150–450)
RBC: 4.21 x10E6/uL (ref 3.77–5.28)
RDW: 15.3 % (ref 11.7–15.4)
WBC: 3.7 10*3/uL (ref 3.4–10.8)

## 2023-01-22 LAB — CMP14+EGFR
ALT: 14 IU/L (ref 0–32)
AST: 18 IU/L (ref 0–40)
Albumin/Globulin Ratio: 1.4 (ref 1.2–2.2)
Albumin: 3.9 g/dL (ref 3.9–4.9)
Alkaline Phosphatase: 45 IU/L (ref 44–121)
BUN/Creatinine Ratio: 10 (ref 9–23)
BUN: 6 mg/dL (ref 6–20)
Bilirubin Total: <0.2 mg/dL (ref 0.0–1.2)
CO2: 19 mmol/L — ABNORMAL LOW (ref 20–29)
Calcium: 9 mg/dL (ref 8.7–10.2)
Chloride: 108 mmol/L — ABNORMAL HIGH (ref 96–106)
Creatinine, Ser: 0.58 mg/dL (ref 0.57–1.00)
Globulin, Total: 2.8 g/dL (ref 1.5–4.5)
Glucose: 87 mg/dL (ref 70–99)
Potassium: 4.5 mmol/L (ref 3.5–5.2)
Sodium: 139 mmol/L (ref 134–144)
Total Protein: 6.7 g/dL (ref 6.0–8.5)
eGFR: 123 mL/min/{1.73_m2} (ref >59)

## 2023-01-22 LAB — LIPID PANEL
Chol/HDL Ratio: 3 ratio (ref 0.0–4.4)
Cholesterol, Total: 170 mg/dL (ref 100–199)
HDL: 57 mg/dL (ref >39)
LDL Chol Calc (NIH): 101 mg/dL — ABNORMAL HIGH (ref 0–99)
Triglycerides: 62 mg/dL (ref 0–149)
VLDL Cholesterol Cal: 12 mg/dL (ref 5–40)

## 2023-01-22 LAB — IRON,TIBC AND FERRITIN PANEL
Ferritin: 4 ng/mL — ABNORMAL LOW (ref 15–150)
Iron Saturation: 3 % — CL (ref 15–55)
Iron: 15 ug/dL — ABNORMAL LOW (ref 27–159)
Total Iron Binding Capacity: 457 ug/dL — ABNORMAL HIGH (ref 250–450)
UIBC: 442 ug/dL — ABNORMAL HIGH (ref 131–425)

## 2023-01-22 LAB — B12 AND FOLATE PANEL
Folate: 6.8 ng/mL (ref >3.0)
Vitamin B-12: 332 pg/mL (ref 232–1245)

## 2023-01-22 LAB — VITAMIN D 25 HYDROXY (VIT D DEFICIENCY, FRACTURES): Vit D, 25-Hydroxy: 6.2 ng/mL — ABNORMAL LOW (ref 30.0–100.0)

## 2023-01-23 ENCOUNTER — Telehealth: Payer: Self-pay

## 2023-01-23 ENCOUNTER — Other Ambulatory Visit: Payer: Self-pay | Admitting: Nurse Practitioner

## 2023-01-23 DIAGNOSIS — D508 Other iron deficiency anemias: Secondary | ICD-10-CM

## 2023-01-23 MED ORDER — FERROUS FUMARATE 325 (106 FE) MG PO TABS: 1.0000 | ORAL_TABLET | Freq: Every day | ORAL | 4 refills | Status: DC

## 2023-01-23 MED ORDER — VITAMIN D (ERGOCALCIFEROL) 1.25 MG (50000 UNIT) PO CAPS: 50000.0000 [IU] | ORAL_CAPSULE | ORAL | 1 refills | Status: DC

## 2023-01-23 NOTE — Telephone Encounter (Signed)
-----   Message from Jonetta Osgood, NP sent at 01/23/2023  8:30 AM EDT ----- Please call the patient and let her know her results: --iron and ferritin are really low again, please start taking the ferrous fumarate again.  Hemoglobin is low at 8.9 this time. We will repeat the lab in 1 month.  Vitamin D is significantly low, I have sent a script in for a weekly prescription vitamin D  B12 is borderline low, please schedule nurse visit for monthly B12 injection.

## 2023-01-23 NOTE — Telephone Encounter (Signed)
Patient notified and was given to Nubi to make monthly b12 injection.

## 2023-01-23 NOTE — Progress Notes (Signed)
Please call the patient and let her know her results: --iron and ferritin are really low again, please start taking the ferrous fumarate again.  Hemoglobin is low at 8.9 this time. We will repeat the lab in 1 month.  Vitamin D is significantly low, I have sent a script in for a weekly prescription vitamin D  B12 is borderline low, please schedule nurse visit for monthly B12 injection.

## 2023-01-26 ENCOUNTER — Ambulatory Visit (INDEPENDENT_AMBULATORY_CARE_PROVIDER_SITE_OTHER): Payer: BLUE CROSS/BLUE SHIELD

## 2023-01-26 DIAGNOSIS — E538 Deficiency of other specified B group vitamins: Secondary | ICD-10-CM

## 2023-01-26 MED ORDER — CYANOCOBALAMIN 1000 MCG/ML IJ SOLN
1000.0000 ug | Freq: Once | INTRAMUSCULAR | Status: AC
  Administered 2023-01-26: 1000 ug via INTRAMUSCULAR

## 2023-02-02 ENCOUNTER — Ambulatory Visit (INDEPENDENT_AMBULATORY_CARE_PROVIDER_SITE_OTHER): Payer: BLUE CROSS/BLUE SHIELD

## 2023-02-02 DIAGNOSIS — E538 Deficiency of other specified B group vitamins: Secondary | ICD-10-CM

## 2023-02-02 MED ORDER — CYANOCOBALAMIN 1000 MCG/ML IJ SOLN
1000.0000 ug | Freq: Once | INTRAMUSCULAR | Status: AC
  Administered 2023-02-02: 1000 ug via INTRAMUSCULAR

## 2023-02-09 ENCOUNTER — Ambulatory Visit (INDEPENDENT_AMBULATORY_CARE_PROVIDER_SITE_OTHER): Payer: BLUE CROSS/BLUE SHIELD

## 2023-02-09 DIAGNOSIS — E538 Deficiency of other specified B group vitamins: Secondary | ICD-10-CM

## 2023-02-09 MED ORDER — CYANOCOBALAMIN 1000 MCG/ML IJ SOLN
1000.0000 ug | Freq: Once | INTRAMUSCULAR | Status: AC
  Administered 2023-02-09: 1000 ug via INTRAMUSCULAR

## 2023-03-09 ENCOUNTER — Ambulatory Visit (INDEPENDENT_AMBULATORY_CARE_PROVIDER_SITE_OTHER): Payer: BLUE CROSS/BLUE SHIELD

## 2023-03-09 DIAGNOSIS — E538 Deficiency of other specified B group vitamins: Secondary | ICD-10-CM | POA: Diagnosis not present

## 2023-03-09 MED ORDER — CYANOCOBALAMIN 1000 MCG/ML IJ SOLN
1000.0000 ug | Freq: Once | INTRAMUSCULAR | Status: AC
  Administered 2023-03-09: 1000 ug via INTRAMUSCULAR

## 2023-04-06 ENCOUNTER — Ambulatory Visit (INDEPENDENT_AMBULATORY_CARE_PROVIDER_SITE_OTHER): Payer: BLUE CROSS/BLUE SHIELD

## 2023-04-06 DIAGNOSIS — E538 Deficiency of other specified B group vitamins: Secondary | ICD-10-CM

## 2023-04-06 MED ORDER — CYANOCOBALAMIN 1000 MCG/ML IJ SOLN
1000.0000 ug | Freq: Once | INTRAMUSCULAR | Status: AC
Start: 2023-04-06 — End: 2023-04-06
  Administered 2023-04-06: 1000 ug via INTRAMUSCULAR

## 2023-05-04 ENCOUNTER — Ambulatory Visit: Payer: BLUE CROSS/BLUE SHIELD

## 2023-05-05 ENCOUNTER — Ambulatory Visit (INDEPENDENT_AMBULATORY_CARE_PROVIDER_SITE_OTHER): Payer: BLUE CROSS/BLUE SHIELD

## 2023-05-05 DIAGNOSIS — E538 Deficiency of other specified B group vitamins: Secondary | ICD-10-CM

## 2023-05-05 MED ORDER — CYANOCOBALAMIN 1000 MCG/ML IJ SOLN
1000.0000 ug | Freq: Once | INTRAMUSCULAR | Status: AC
Start: 2023-05-05 — End: 2023-05-05
  Administered 2023-05-05: 1000 ug via INTRAMUSCULAR

## 2023-06-08 ENCOUNTER — Ambulatory Visit: Payer: BLUE CROSS/BLUE SHIELD

## 2023-06-09 ENCOUNTER — Ambulatory Visit: Payer: BLUE CROSS/BLUE SHIELD

## 2023-07-07 ENCOUNTER — Ambulatory Visit: Payer: BLUE CROSS/BLUE SHIELD

## 2023-08-04 ENCOUNTER — Encounter: Payer: Self-pay | Admitting: Nurse Practitioner

## 2023-08-04 ENCOUNTER — Ambulatory Visit (INDEPENDENT_AMBULATORY_CARE_PROVIDER_SITE_OTHER): Payer: BLUE CROSS/BLUE SHIELD | Admitting: Nurse Practitioner

## 2023-08-04 VITALS — BP 120/68 | HR 80 | Temp 98.4°F | Resp 16 | Ht 62.0 in | Wt 130.8 lb

## 2023-08-04 DIAGNOSIS — E611 Iron deficiency: Secondary | ICD-10-CM | POA: Diagnosis not present

## 2023-08-04 DIAGNOSIS — E559 Vitamin D deficiency, unspecified: Secondary | ICD-10-CM | POA: Diagnosis not present

## 2023-08-04 DIAGNOSIS — L659 Nonscarring hair loss, unspecified: Secondary | ICD-10-CM | POA: Diagnosis not present

## 2023-08-04 DIAGNOSIS — D510 Vitamin B12 deficiency anemia due to intrinsic factor deficiency: Secondary | ICD-10-CM | POA: Insufficient documentation

## 2023-08-04 NOTE — Progress Notes (Signed)
[facility] [address] [place], [place] [address]  Internal MEDICINE  Office Visit Note  Patient Name: Isabella Bennett  [id number]  [id number]  Date of Service: 08/04/2023  Chief Complaint  Patient presents with   Anemia    Low energy, low B12, iron, salt craving and hair loss.     HPI Isabella Bennett presents for a follow-up visit for pernicious anemia, possible viral URI, and hair loss, low vitamin D Low B12 and anemia -- persistent and recurring. May be only due to diet, but may also be other factors.  Fever, sore throat, coughing, head feels heavy, body aches -- symptoms started yesterday. Her daughter has been sick for 10 days and she is taking her to the pediatrician today. She will call back if she is treated for a bacterial infection.  Hair loss increased and previous very low vitamin D     Current Medication: Outpatient Encounter Medications as of 08/04/2023  Medication Sig   ferrous fumarate (HEMOCYTE - 106 MG FE) 325 (106 Fe) MG TABS tablet Take 1 tablet (106 mg of iron total) by mouth daily.   Vitamin D, Ergocalciferol, (DRISDOL) 1.25 MG (50000 UNIT) CAPS capsule Take 1 capsule (50,000 Units total) by mouth every 7 (seven) days.   No facility-administered encounter medications on file as of 08/04/2023.    Surgical History: Past Surgical History:  Procedure Laterality Date   CESAREAN SECTION  07/21/2017   Procedure: CESAREAN SECTION;  Surgeon: Schermerhorn, Ihor Austin, MD;  Location: [facility] ORS;  Service: Obstetrics;;    Medical History: Past Medical History:  Diagnosis Date   Depression    Medical history non-contributory     Family History: Family History  Problem Relation Age of Onset   Liver disease Mother    Kidney Stones Mother    Hypertension Father     Social History   Socioeconomic History   Marital status: Married    Spouse name: Not on file   Number of children: Not on file   Years of education: Not on file   Highest education level: Not  on file  Occupational History   Not on file  Tobacco Use   Smoking status: Never    Passive exposure: Never   Smokeless tobacco: Never  Substance and Sexual Activity   Alcohol use: Not Currently   Drug use: No   Sexual activity: Yes  Other Topics Concern   Not on file  Social History Narrative   Not on file   Social Determinants of Health   Financial Resource Strain: Not on file  Food Insecurity: Not on file  Transportation Needs: Not on file  Physical Activity: Not on file  Stress: Not on file  Social Connections: Not on file  Intimate Partner Violence: Not on file      Review of Systems  Constitutional:  Negative for chills, fatigue and unexpected weight change.  HENT:  Negative for congestion, rhinorrhea, sneezing and sore throat.   Eyes:  Negative for redness.  Respiratory:  Negative for cough, chest tightness and shortness of breath.   Cardiovascular:  Negative for chest pain and palpitations.  Gastrointestinal:  Negative for abdominal pain, constipation, diarrhea, nausea and vomiting.  Genitourinary:  Negative for dysuria and frequency.  Musculoskeletal:  Negative for arthralgias, back pain, joint swelling and neck pain.  Skin:  Negative for rash.  Neurological: Negative.  Negative for tremors and numbness.  Hematological:  Negative for adenopathy. Does not bruise/bleed easily.  Psychiatric/Behavioral:  Negative for behavioral problems (  Depression), sleep disturbance and suicidal ideas. The patient is not nervous/anxious.     Vital Signs: BP 120/68   Pulse 80   Temp 98.4 F (36.9 C)   Resp 16   Ht 5\' 2"  (1.575 m)   Wt 130 lb 12.8 oz (59.3 kg)   SpO2 99%   BMI 23.92 kg/m    Physical Exam Vitals reviewed.  Constitutional:      General: She is not in acute distress.    Appearance: Normal appearance. She is normal weight. She is not ill-appearing.  HENT:     Head: Normocephalic and atraumatic.  Eyes:     Pupils: Pupils are equal, round, and reactive  to light.  Cardiovascular:     Rate and Rhythm: Normal rate and regular rhythm.  Pulmonary:     Effort: Pulmonary effort is normal. No respiratory distress.  Neurological:     Mental Status: She is alert and oriented to person, place, and time.  Psychiatric:        Mood and Affect: Mood normal.        Behavior: Behavior normal.        Assessment/Plan: 1. Pernicious anemia Routine labs ordered  - Vitamin B12 Deficiency [place] - Iron, TIBC and Ferritin Panel - CBC with Differential/Platelet - TSH + free T4  2. Dietary iron deficiency Routine labs ordered  - Vitamin B12 Deficiency [place] - Iron, TIBC and Ferritin Panel - CBC with Differential/Platelet - TSH + free T4  3. Vitamin D deficiency Routine labs ordered  - Vitamin D (25 hydroxy) - TSH + free T4  4. Hair loss disorder Routine labs ordered  - Vitamin B12 Deficiency [place] - Iron, TIBC and Ferritin Panel - CBC with Differential/Platelet - Vitamin D (25 hydroxy) - TSH + free T4   General Counseling: Isabella Bennett verbalizes understanding of the findings of todays visit and agrees with plan of treatment. I have discussed any further diagnostic evaluation that may be needed or ordered today. We also reviewed her medications today. she has been encouraged to call the office with any questions or concerns that should arise related to todays visit.    Orders Placed This Encounter  Procedures   Vitamin B12 Deficiency [place]ascade   Iron, TIBC and Ferritin Panel   CBC with Differential/Platelet   Vitamin D (25 hydroxy)   TSH + free T4    No orders of the defined types were placed in this encounter.   Return for  F/U, Labs, [place] PCP, lab results at next visit .   Total time spent:30 Minutes Time spent includes review of chart, medications, test results, and follow up plan with the patient.   [place] Controlled Substance Database was reviewed by me.  This patient was seen by Sallyanne Kuster, FNP-C in collaboration  with Dr. Beverely Risen as a part of collaborative care agreement.   Isabella Sakurai R. Tedd Sias, MSN, FNP-C Internal medicine

## 2023-08-05 LAB — CBC WITH DIFFERENTIAL/PLATELET
Basophils Absolute: 0 10*3/uL (ref 0.0–0.2)
Basos: 0 %
EOS (ABSOLUTE): 0.2 10*3/uL (ref 0.0–0.4)
Eos: 3 %
Hematocrit: 30 % — ABNORMAL LOW (ref 34.0–46.6)
Hemoglobin: 8.1 g/dL — ABNORMAL LOW (ref 11.1–15.9)
Immature Grans (Abs): 0 10*3/uL (ref 0.0–0.1)
Immature Granulocytes: 0 %
Lymphocytes Absolute: 1.3 10*3/uL (ref 0.7–3.1)
Lymphs: 24 %
MCH: 17.5 pg — ABNORMAL LOW (ref 26.6–33.0)
MCHC: 27 g/dL — ABNORMAL LOW (ref 31.5–35.7)
MCV: 65 fL — ABNORMAL LOW (ref 79–97)
Monocytes Absolute: 0.3 10*3/uL (ref 0.1–0.9)
Monocytes: 5 %
Neutrophils Absolute: 3.9 10*3/uL (ref 1.4–7.0)
Neutrophils: 68 %
Platelets: 303 10*3/uL (ref 150–450)
RBC: 4.64 x10E6/uL (ref 3.77–5.28)
RDW: 16.8 % — ABNORMAL HIGH (ref 11.7–15.4)
WBC: 5.7 10*3/uL (ref 3.4–10.8)

## 2023-08-05 LAB — IRON,TIBC AND FERRITIN PANEL
Ferritin: 2 ng/mL — ABNORMAL LOW (ref 15–150)
Iron Saturation: 3 % — CL (ref 15–55)
Iron: 15 ug/dL — ABNORMAL LOW (ref 27–159)
Total Iron Binding Capacity: 465 ug/dL — ABNORMAL HIGH (ref 250–450)
UIBC: 450 ug/dL — ABNORMAL HIGH (ref 131–425)

## 2023-08-05 LAB — VITAMIN D 25 HYDROXY (VIT D DEFICIENCY, FRACTURES): Vit D, 25-Hydroxy: 13.1 ng/mL — ABNORMAL LOW (ref 30.0–100.0)

## 2023-08-05 LAB — TSH+FREE T4
Free T4: 1.08 ng/dL (ref 0.82–1.77)
TSH: 1.54 u[IU]/mL (ref 0.450–4.500)

## 2023-08-05 LAB — INTERPRETATION

## 2023-08-05 LAB — VITAMIN B12 DEFICIENCY CASCADE: Vitamin B-12: 425 pg/mL (ref 232–1245)

## 2023-08-10 ENCOUNTER — Ambulatory Visit: Payer: BLUE CROSS/BLUE SHIELD

## 2023-08-12 ENCOUNTER — Encounter: Payer: Self-pay | Admitting: Nurse Practitioner

## 2023-08-12 ENCOUNTER — Ambulatory Visit (INDEPENDENT_AMBULATORY_CARE_PROVIDER_SITE_OTHER): Payer: BLUE CROSS/BLUE SHIELD | Admitting: Nurse Practitioner

## 2023-08-12 VITALS — BP 100/72 | HR 63 | Temp 98.4°F | Resp 16 | Ht 62.0 in | Wt 129.0 lb

## 2023-08-12 DIAGNOSIS — D508 Other iron deficiency anemias: Secondary | ICD-10-CM | POA: Diagnosis not present

## 2023-08-12 DIAGNOSIS — E538 Deficiency of other specified B group vitamins: Secondary | ICD-10-CM

## 2023-08-12 DIAGNOSIS — E559 Vitamin D deficiency, unspecified: Secondary | ICD-10-CM | POA: Diagnosis not present

## 2023-08-12 DIAGNOSIS — R4586 Emotional lability: Secondary | ICD-10-CM

## 2023-08-12 MED ORDER — FERROUS FUMARATE 325 (106 FE) MG PO TABS
1.0000 | ORAL_TABLET | Freq: Every day | ORAL | 4 refills | Status: DC
Start: 2023-08-12 — End: 2024-01-21

## 2023-08-12 MED ORDER — CYANOCOBALAMIN 1000 MCG/ML IJ SOLN
1000.0000 ug | Freq: Once | INTRAMUSCULAR | Status: AC
Start: 2023-08-12 — End: 2023-08-12
  Administered 2023-08-12: 1000 ug via INTRAMUSCULAR

## 2023-08-12 NOTE — Progress Notes (Signed)
[facility] [address] [place], [place] [address]  Internal MEDICINE  Office Visit Note  Patient Name: Isabella Bennett  [id number]  [id number]  Date of Service: 08/12/2023  Chief Complaint  Patient presents with   Depression   Follow-up    Review labs    HPI Isabella Bennett presents for a follow-up visit for anemia, low iron, low B12, low vitamin D, and mood swings  B12 deficiency-- level is low normal, requests B12 injection Iron deficiency --- serum iron level 15, ferritin at 2 Anemia -- hgb 8.1 Vitamin D deficiency -- low at 13.1 Thyroid labs are normal  Mood swings -- Staying away from alcohol, THC and caffeine due to mood changes. Will monitor    Current Medication: Outpatient Encounter Medications as of 08/12/2023  Medication Sig   Vitamin D, Ergocalciferol, (DRISDOL) 1.25 MG (50000 UNIT) CAPS capsule Take 1 capsule (50,000 Units total) by mouth every 7 (seven) days.   [DISCONTINUED] ferrous fumarate (HEMOCYTE - 106 MG FE) 325 (106 Fe) MG TABS tablet Take 1 tablet (106 mg of iron total) by mouth daily.   ferrous fumarate (HEMOCYTE - 106 MG FE) 325 (106 Fe) MG TABS tablet Take 1 tablet (106 mg of iron total) by mouth daily.   Facility-Administered Encounter Medications as of 08/12/2023  Medication   cyanocobalamin (VITAMIN B12) injection 1,000 mcg    Surgical History: Past Surgical History:  Procedure Laterality Date   CESAREAN SECTION  07/21/2017   Procedure: CESAREAN SECTION;  Surgeon: Schermerhorn, Ihor Austin, MD;  Location: [facility] ORS;  Service: Obstetrics;;    Medical History: Past Medical History:  Diagnosis Date   Depression    Medical history non-contributory     Family History: Family History  Problem Relation Age of Onset   Liver disease Mother    Kidney Stones Mother    Hypertension Father     Social History   Socioeconomic History   Marital status: Married    Spouse name: Not on file   Number of children: Not on file   Years of education:  Not on file   Highest education level: Not on file  Occupational History   Not on file  Tobacco Use   Smoking status: Never    Passive exposure: Never   Smokeless tobacco: Never  Substance and Sexual Activity   Alcohol use: Not Currently   Drug use: No   Sexual activity: Yes  Other Topics Concern   Not on file  Social History Narrative   Not on file   Social Determinants of Health   Financial Resource Strain: Not on file  Food Insecurity: Not on file  Transportation Needs: Not on file  Physical Activity: Not on file  Stress: Not on file  Social Connections: Not on file  Intimate Partner Violence: Not on file      Review of Systems  Constitutional:  Positive for fatigue. Negative for chills and unexpected weight change.  HENT:  Negative for congestion, rhinorrhea, sneezing and sore throat.   Eyes:  Negative for redness.  Respiratory:  Negative for cough, chest tightness and shortness of breath.   Cardiovascular:  Negative for chest pain and palpitations.  Gastrointestinal:  Negative for abdominal pain, constipation, diarrhea, nausea and vomiting.  Genitourinary:  Negative for dysuria and frequency.  Musculoskeletal:  Negative for arthralgias, back pain, joint swelling and neck pain.  Skin:  Negative for rash.  Neurological:  Positive for dizziness and weakness. Negative for tremors and numbness.  Hematological:  Negative for  adenopathy. Does not bruise/bleed easily.  Psychiatric/Behavioral:  Negative for behavioral problems (Depression), sleep disturbance and suicidal ideas. The patient is not nervous/anxious.     Vital Signs: BP 100/72   Pulse 63   Temp 98.4 F (36.9 C)   Resp 16   Ht 5\' 2"  (1.575 m)   Wt 129 lb (58.5 kg)   SpO2 99%   BMI 23.59 kg/m    Physical Exam Vitals reviewed.  Constitutional:      General: She is not in acute distress.    Appearance: Normal appearance. She is normal weight. She is not ill-appearing.  HENT:     Head: Normocephalic  and atraumatic.  Eyes:     Pupils: Pupils are equal, round, and reactive to light.  Cardiovascular:     Rate and Rhythm: Normal rate and regular rhythm.  Pulmonary:     Effort: Pulmonary effort is normal. No respiratory distress.  Neurological:     Mental Status: She is alert and oriented to person, place, and time.  Psychiatric:        Mood and Affect: Mood normal.        Behavior: Behavior normal.        Assessment/Plan: 1. Other iron deficiency anemia Restart iron supplement and B12 injections. Recheck labs in 3 months  - ferrous fumarate (HEMOCYTE - 106 MG FE) 325 (106 Fe) MG TABS tablet; Take 1 tablet (106 mg of iron total) by mouth daily.  Dispense: 30 tablet; Refill: 4 - cyanocobalamin (VITAMIN B12) injection 1,000 mcg  2. B12 deficiency B12 injection administered in office today  3. Vitamin D deficiency Restart vitamin D supplement  4. Mood swings Patient will monitor her mood while avoiding caffeine, alcohol and THC gummies and call clinic if she has episodes while not using these substances.   General Counseling: Isabella Bennett verbalizes understanding of the findings of todays visit and agrees with plan of treatment. I have discussed any further diagnostic evaluation that may be needed or ordered today. We also reviewed her medications today. she has been encouraged to call the office with any questions or concerns that should arise related to todays visit.    No orders of the defined types were placed in this encounter.   Meds ordered this encounter  Medications   ferrous fumarate (HEMOCYTE - 106 MG FE) 325 (106 Fe) MG TABS tablet    Sig: Take 1 tablet (106 mg of iron total) by mouth daily.    Dispense:  30 tablet    Refill:  4   cyanocobalamin (VITAMIN B12) injection 1,000 mcg    Return in about 3 months (around 11/12/2023) for F/U, Labs, Isabella Bennett PCP.   Total time spent:30 Minutes Time spent includes review of chart, medications, test results, and follow up  plan with the patient.   [place] Controlled Substance Database was reviewed by me.  This patient was seen by Sallyanne Kuster, FNP-C in collaboration with Dr. Beverely Risen as a part of collaborative care agreement.   Isabella Hedger R. Tedd Sias, MSN, FNP-C Internal medicine

## 2023-11-12 ENCOUNTER — Ambulatory Visit: Payer: BLUE CROSS/BLUE SHIELD | Admitting: Nurse Practitioner

## 2024-01-04 ENCOUNTER — Ambulatory Visit (INDEPENDENT_AMBULATORY_CARE_PROVIDER_SITE_OTHER): Payer: BLUE CROSS/BLUE SHIELD | Admitting: Nurse Practitioner

## 2024-01-04 ENCOUNTER — Encounter: Payer: Self-pay | Admitting: Nurse Practitioner

## 2024-01-04 VITALS — BP 108/68 | HR 75 | Temp 98.3°F | Resp 16 | Ht 62.0 in | Wt 133.0 lb

## 2024-01-04 DIAGNOSIS — E559 Vitamin D deficiency, unspecified: Secondary | ICD-10-CM | POA: Diagnosis not present

## 2024-01-04 DIAGNOSIS — D649 Anemia, unspecified: Secondary | ICD-10-CM | POA: Insufficient documentation

## 2024-01-04 DIAGNOSIS — D508 Other iron deficiency anemias: Secondary | ICD-10-CM | POA: Diagnosis not present

## 2024-01-04 DIAGNOSIS — L659 Nonscarring hair loss, unspecified: Secondary | ICD-10-CM

## 2024-01-04 DIAGNOSIS — E538 Deficiency of other specified B group vitamins: Secondary | ICD-10-CM | POA: Diagnosis not present

## 2024-01-04 DIAGNOSIS — E7800 Pure hypercholesterolemia, unspecified: Secondary | ICD-10-CM

## 2024-01-04 NOTE — Progress Notes (Signed)
 [facility] [address] [place], [place] [address]  Internal MEDICINE  Office Visit Note  Patient Name: Isabella Bennett  [id number]  [id number]  Date of Service: 01/04/2024  Chief Complaint  Patient presents with   Depression   Follow-up    Eval new med.     HPI Jermisha presents for a follow-up visit for routine labs, anemia, vitamin deficiencies.  Iron deficiency anemia -- chronic issue, due for repeat labs.  B12 deficiency -- has been taking B12 supplement   Vitamin D deficiency -- has not been going outside much due to cold weather and not currently taking a supplement  Due for routine labs for annual CPE Slightly elevated LDL last year  Thinking about having another baby this year.     Current Medication: Outpatient Encounter Medications as of 01/04/2024  Medication Sig   ferrous fumarate (HEMOCYTE - 106 MG FE) 325 (106 Fe) MG TABS tablet Take 1 tablet (106 mg of iron total) by mouth daily.   Vitamin D, Ergocalciferol, (DRISDOL) 1.25 MG (50000 UNIT) CAPS capsule Take 1 capsule (50,000 Units total) by mouth every 7 (seven) days.   No facility-administered encounter medications on file as of 01/04/2024.    Surgical History: Past Surgical History:  Procedure Laterality Date   CESAREAN SECTION  07/21/2017   Procedure: CESAREAN SECTION;  Surgeon: Schermerhorn, Ihor Austin, MD;  Location: [facility] ORS;  Service: Obstetrics;;    Medical History: Past Medical History:  Diagnosis Date   Depression    Medical history non-contributory     Family History: Family History  Problem Relation Age of Onset   Liver disease Mother    Kidney Stones Mother    Hypertension Father     Social History   Socioeconomic History   Marital status: Married    Spouse name: Not on file   Number of children: Not on file   Years of education: Not on file   Highest education level: Not on file  Occupational History   Not on file  Tobacco Use   Smoking status: Never    Passive  exposure: Never   Smokeless tobacco: Never  Substance and Sexual Activity   Alcohol use: Not Currently   Drug use: No   Sexual activity: Yes  Other Topics Concern   Not on file  Social History Narrative   Not on file   Social Drivers of Health   Financial Resource Strain: Not on file  Food Insecurity: Not on file  Transportation Needs: Not on file  Physical Activity: Not on file  Stress: Not on file  Social Connections: Not on file  Intimate Partner Violence: Not on file      Review of Systems  Constitutional:  Positive for fatigue. Negative for chills and unexpected weight change.  HENT:  Negative for congestion, rhinorrhea, sneezing and sore throat.   Eyes:  Negative for redness.  Respiratory:  Negative for cough, chest tightness and shortness of breath.   Cardiovascular:  Negative for chest pain and palpitations.  Gastrointestinal:  Negative for abdominal pain, constipation, diarrhea, nausea and vomiting.  Genitourinary:  Negative for dysuria and frequency.  Musculoskeletal:  Negative for arthralgias, back pain, joint swelling and neck pain.  Skin:  Negative for rash.  Neurological:  Positive for dizziness and weakness. Negative for tremors and numbness.  Hematological:  Negative for adenopathy. Does not bruise/bleed easily.  Psychiatric/Behavioral:  Positive for depression. Negative for behavioral problems (Depression), sleep disturbance and suicidal ideas. The patient is not nervous/anxious.  Vital Signs: BP 108/68   Pulse 75   Temp 98.3 F (36.8 C)   Resp 16   Ht 5\' 2"  (1.575 m)   Wt 133 lb (60.3 kg)   SpO2 97%   BMI 24.33 kg/m    Physical Exam Vitals reviewed.  Constitutional:      General: She is not in acute distress.    Appearance: Normal appearance. She is normal weight. She is not ill-appearing.  HENT:     Head: Normocephalic and atraumatic.  Eyes:     Pupils: Pupils are equal, round, and reactive to light.  Cardiovascular:     Rate and  Rhythm: Normal rate and regular rhythm.  Pulmonary:     Effort: Pulmonary effort is normal. No respiratory distress.  Neurological:     Mental Status: She is alert and oriented to person, place, and time.  Psychiatric:        Mood and Affect: Mood normal.        Behavior: Behavior normal.        Assessment/Plan: 1. Other iron deficiency anemia (Primary) Routine labs ordered  - CBC with Differential/Platelet - CMP14+EGFR - Lipid Profile - B12 and Folate Panel - Iron, TIBC and Ferritin Panel  2. B12 deficiency Routine labs ordered  - CBC with Differential/Platelet - B12 and Folate Panel - Iron, TIBC and Ferritin Panel  3. Vitamin D deficiency Routine labs ordered  - Vitamin D (25 hydroxy) - TSH + free T4  4. Hair loss disorder Routine labs ordered  - CMP14+EGFR - Vitamin D (25 hydroxy) - TSH + free T4  5. Elevated low density lipoprotein (LDL) cholesterol level Routine labs ordered  - CBC with Differential/Platelet - CMP14+EGFR - Lipid Profile   General Counseling: Marielis verbalizes understanding of the findings of todays visit and agrees with plan of treatment. I have discussed any further diagnostic evaluation that may be needed or ordered today. We also reviewed her medications today. she has been encouraged to call the office with any questions or concerns that should arise related to todays visit.    Orders Placed This Encounter  Procedures   CBC with Differential/Platelet   CMP14+EGFR   Lipid Profile   B12 and Folate Panel   Iron, TIBC and Ferritin Panel   Vitamin D (25 hydroxy)   TSH + free T4    No orders of the defined types were placed in this encounter.   Return for previously scheduled, CPE, Oreta Soloway PCP later this month.   Total time spent:30 Minutes Time spent includes review of chart, medications, test results, and follow up plan with the patient.   [place] Controlled Substance Database was reviewed by me.  This patient was seen by Sallyanne Kuster, FNP-C in collaboration with Dr. Beverely Risen as a part of collaborative care agreement.   Leinani Lisbon R. Tedd Sias, MSN, FNP-C Internal medicine

## 2024-01-05 LAB — CBC WITH DIFFERENTIAL/PLATELET
Basophils Absolute: 0 10*3/uL (ref 0.0–0.2)
Basos: 1 %
EOS (ABSOLUTE): 0.1 10*3/uL (ref 0.0–0.4)
Eos: 2 %
Hematocrit: 29.4 % — ABNORMAL LOW (ref 34.0–46.6)
Hemoglobin: 8.1 g/dL — ABNORMAL LOW (ref 11.1–15.9)
Immature Grans (Abs): 0 10*3/uL (ref 0.0–0.1)
Immature Granulocytes: 0 %
Lymphocytes Absolute: 1.5 10*3/uL (ref 0.7–3.1)
Lymphs: 41 %
MCH: 16.6 pg — ABNORMAL LOW (ref 26.6–33.0)
MCHC: 27.6 g/dL — ABNORMAL LOW (ref 31.5–35.7)
MCV: 60 fL — ABNORMAL LOW (ref 79–97)
Monocytes Absolute: 0.2 10*3/uL (ref 0.1–0.9)
Monocytes: 6 %
Neutrophils Absolute: 1.9 10*3/uL (ref 1.4–7.0)
Neutrophils: 50 %
Platelets: 369 10*3/uL (ref 150–450)
RBC: 4.87 x10E6/uL (ref 3.77–5.28)
RDW: 18.3 % — ABNORMAL HIGH (ref 11.7–15.4)
WBC: 3.7 10*3/uL (ref 3.4–10.8)

## 2024-01-05 LAB — CMP14+EGFR
ALT: 12 IU/L (ref 0–32)
AST: 22 IU/L (ref 0–40)
Albumin: 4.3 g/dL (ref 3.9–4.9)
Alkaline Phosphatase: 51 IU/L (ref 44–121)
BUN/Creatinine Ratio: 12 (ref 9–23)
BUN: 8 mg/dL (ref 6–20)
Bilirubin Total: <0.2 mg/dL (ref 0.0–1.2)
CO2: 22 mmol/L (ref 20–29)
Calcium: 9.2 mg/dL (ref 8.7–10.2)
Chloride: 106 mmol/L (ref 96–106)
Creatinine, Ser: 0.65 mg/dL (ref 0.57–1.00)
Globulin, Total: 3.1 g/dL (ref 1.5–4.5)
Glucose: 90 mg/dL (ref 70–99)
Potassium: 4.4 mmol/L (ref 3.5–5.2)
Sodium: 139 mmol/L (ref 134–144)
Total Protein: 7.4 g/dL (ref 6.0–8.5)
eGFR: 119 mL/min/{1.73_m2} (ref >59)

## 2024-01-05 LAB — IRON,TIBC AND FERRITIN PANEL
Ferritin: 3 ng/mL — ABNORMAL LOW (ref 15–150)
Iron Saturation: 2 % — CL (ref 15–55)
Iron: 13 ug/dL — ABNORMAL LOW (ref 27–159)
Total Iron Binding Capacity: 531 ug/dL — ABNORMAL HIGH (ref 250–450)
UIBC: 518 ug/dL — ABNORMAL HIGH (ref 131–425)

## 2024-01-05 LAB — B12 AND FOLATE PANEL
Folate: 7.4 ng/mL (ref >3.0)
Vitamin B-12: 328 pg/mL (ref 232–1245)

## 2024-01-05 LAB — LIPID PANEL
Chol/HDL Ratio: 2.9 ratio (ref 0.0–4.4)
Cholesterol, Total: 178 mg/dL (ref 100–199)
HDL: 62 mg/dL (ref >39)
LDL Chol Calc (NIH): 107 mg/dL — ABNORMAL HIGH (ref 0–99)
Triglycerides: 47 mg/dL (ref 0–149)
VLDL Cholesterol Cal: 9 mg/dL (ref 5–40)

## 2024-01-05 LAB — VITAMIN D 25 HYDROXY (VIT D DEFICIENCY, FRACTURES): Vit D, 25-Hydroxy: 5 ng/mL — ABNORMAL LOW (ref 30.0–100.0)

## 2024-01-05 LAB — TSH+FREE T4
Free T4: 1.17 ng/dL (ref 0.82–1.77)
TSH: 3.66 u[IU]/mL (ref 0.450–4.500)

## 2024-01-12 ENCOUNTER — Encounter: Payer: Self-pay | Admitting: Nurse Practitioner

## 2024-01-21 ENCOUNTER — Ambulatory Visit (INDEPENDENT_AMBULATORY_CARE_PROVIDER_SITE_OTHER): Payer: BLUE CROSS/BLUE SHIELD | Admitting: Nurse Practitioner

## 2024-01-21 ENCOUNTER — Encounter: Payer: Self-pay | Admitting: Nurse Practitioner

## 2024-01-21 VITALS — BP 110/64 | HR 73 | Temp 98.6°F | Resp 16 | Ht 62.0 in | Wt 135.4 lb

## 2024-01-21 DIAGNOSIS — E559 Vitamin D deficiency, unspecified: Secondary | ICD-10-CM

## 2024-01-21 DIAGNOSIS — D508 Other iron deficiency anemias: Secondary | ICD-10-CM

## 2024-01-21 DIAGNOSIS — E538 Deficiency of other specified B group vitamins: Secondary | ICD-10-CM

## 2024-01-21 DIAGNOSIS — Z0001 Encounter for general adult medical examination with abnormal findings: Secondary | ICD-10-CM | POA: Diagnosis not present

## 2024-01-21 DIAGNOSIS — E7800 Pure hypercholesterolemia, unspecified: Secondary | ICD-10-CM

## 2024-01-21 MED ORDER — FERROUS FUMARATE 325 (106 FE) MG PO TABS: 1.0000 | ORAL_TABLET | Freq: Every day | ORAL | 4 refills | Status: DC

## 2024-01-21 NOTE — Progress Notes (Signed)
 [facility] [address] [place], [place] [address]  Internal MEDICINE  Office Visit Note  Patient Name: Isabella Bennett  [id number]  [id number]  Date of Service: 01/21/2024  Chief Complaint  Patient presents with   Depression   Annual Exam    HPI Isabella Bennett presents for an annual well visit and physical exam.  Well-appearing 33 y.o. female with iron deficiency anemia, vitamin D deficiency, and B12 deficiency.  Pap smear: due in 2028 Labs: lab results reviewed  New or worsening pain: none  IDA -- CBC is anemic, slight elevated LDL, very low vitamin D, low normal B12 Thyroid and CMP are normal.  Restarted iron supplement recently, also starting doing regular exercise.   Current Medication: Outpatient Encounter Medications as of 01/21/2024  Medication Sig   Vitamin D, Ergocalciferol, (DRISDOL) 1.25 MG (50000 UNIT) CAPS capsule Take 1 capsule (50,000 Units total) by mouth every 7 (seven) days.   [DISCONTINUED] ferrous fumarate (HEMOCYTE - 106 MG FE) 325 (106 Fe) MG TABS tablet Take 1 tablet (106 mg of iron total) by mouth daily.   ferrous fumarate (HEMOCYTE - 106 MG FE) 325 (106 Fe) MG TABS tablet Take 1 tablet (106 mg of iron total) by mouth daily.   No facility-administered encounter medications on file as of 01/21/2024.    Surgical History: Past Surgical History:  Procedure Laterality Date   CESAREAN SECTION  07/21/2017   Procedure: CESAREAN SECTION;  Surgeon: Schermerhorn, Ihor Austin, MD;  Location: [facility] ORS;  Service: Obstetrics;;    Medical History: Past Medical History:  Diagnosis Date   Depression    Medical history non-contributory     Family History: Family History  Problem Relation Age of Onset   Liver disease Mother    Kidney Stones Mother    Hypertension Father     Social History   Socioeconomic History   Marital status: Married    Spouse name: Not on file   Number of children: Not on file   Years of education: Not on file   Highest education  level: Not on file  Occupational History   Not on file  Tobacco Use   Smoking status: Never    Passive exposure: Never   Smokeless tobacco: Never  Substance and Sexual Activity   Alcohol use: Not Currently   Drug use: No   Sexual activity: Yes  Other Topics Concern   Not on file  Social History Narrative   Not on file   Social Drivers of Health   Financial Resource Strain: Not on file  Food Insecurity: Not on file  Transportation Needs: Not on file  Physical Activity: Not on file  Stress: Not on file  Social Connections: Not on file  Intimate Partner Violence: Not on file      Review of Systems  Constitutional:  Positive for fatigue. Negative for activity change, appetite change, chills, fever and unexpected weight change.  HENT: Negative.  Negative for congestion, ear pain, rhinorrhea, sneezing, sore throat and trouble swallowing.   Eyes: Negative.  Negative for redness.  Respiratory: Negative.  Negative for cough, chest tightness, shortness of breath and wheezing.   Cardiovascular: Negative.  Negative for chest pain and palpitations.  Gastrointestinal: Negative.  Negative for abdominal pain, blood in stool, constipation, diarrhea, nausea and vomiting.  Endocrine: Negative.   Genitourinary: Negative.  Negative for difficulty urinating, dysuria, frequency, hematuria and urgency.  Musculoskeletal: Negative.  Negative for arthralgias, back pain, joint swelling, myalgias and neck pain.  Skin: Negative.  Negative  for rash and wound.  Allergic/Immunologic: Negative.  Negative for immunocompromised state.  Neurological:  Positive for dizziness and weakness. Negative for tremors, seizures, numbness and headaches.  Hematological: Negative.  Negative for adenopathy. Does not bruise/bleed easily.  Psychiatric/Behavioral: Negative.  Negative for behavioral problems (Depression), self-injury, sleep disturbance and suicidal ideas. The patient is not nervous/anxious.     Vital  Signs: BP 110/64   Pulse 73   Temp 98.6 F (37 C)   Resp 16   Ht 5\' 2"  (1.575 m)   Wt 135 lb 6.4 oz (61.4 kg)   SpO2 99%   BMI 24.76 kg/m    Physical Exam Vitals reviewed.  Constitutional:      General: She is awake. She is not in acute distress.    Appearance: Normal appearance. She is well-developed, well-groomed and overweight. She is not ill-appearing or diaphoretic.  HENT:     Head: Normocephalic and atraumatic.     Right Ear: Tympanic membrane, ear canal and external ear normal.     Left Ear: Tympanic membrane, ear canal and external ear normal.     Nose: Nose normal. No congestion or rhinorrhea.     Mouth/Throat:     Lips: Pink.     Mouth: Mucous membranes are moist.     Pharynx: Oropharynx is clear. Uvula midline. No oropharyngeal exudate or posterior oropharyngeal erythema.  Eyes:     General: Lids are normal. Vision grossly intact. Gaze aligned appropriately. No scleral icterus.       Right eye: No discharge.        Left eye: No discharge.     Extraocular Movements: Extraocular movements intact.     Conjunctiva/sclera: Conjunctivae normal.     Pupils: Pupils are equal, round, and reactive to light.     Funduscopic exam:    Right eye: Red reflex present.        Left eye: Red reflex present. Neck:     Thyroid: No thyromegaly.     Vascular: No JVD.     Trachea: Trachea and phonation normal. No tracheal deviation.  Cardiovascular:     Rate and Rhythm: Normal rate and regular rhythm.     Pulses: Normal pulses.     Heart sounds: Normal heart sounds, S1 normal and S2 normal. No murmur heard.    No friction rub. No gallop.  Pulmonary:     Effort: Pulmonary effort is normal. No accessory muscle usage or respiratory distress.     Breath sounds: Normal breath sounds and air entry. No stridor. No wheezing or rales.  Chest:     Chest wall: No tenderness.  Breasts:    Right: Normal. No swelling, bleeding, inverted nipple, mass, nipple discharge, skin change or  tenderness.     Left: Normal. No swelling, bleeding, inverted nipple, mass, nipple discharge, skin change or tenderness.  Abdominal:     General: Bowel sounds are normal. There is no distension.     Palpations: Abdomen is soft. There is no shifting dullness, fluid wave, mass or pulsatile mass.     Tenderness: There is no abdominal tenderness. There is no guarding or rebound.  Musculoskeletal:        General: No tenderness or deformity. Normal range of motion.     Cervical back: Normal range of motion and neck supple.     Right lower leg: No edema.     Left lower leg: No edema.  Lymphadenopathy:     Cervical: No cervical adenopathy.  Upper Body:     Right upper body: No supraclavicular, axillary or pectoral adenopathy.     Left upper body: No supraclavicular, axillary or pectoral adenopathy.  Skin:    General: Skin is warm and dry.     Capillary Refill: Capillary refill takes less than 2 seconds.     Coloration: Skin is not pale.     Findings: No erythema or rash.  Neurological:     Mental Status: She is alert and oriented to person, place, and time.     Cranial Nerves: No cranial nerve deficit.     Motor: No abnormal muscle tone.     Coordination: Coordination normal.     Gait: Gait normal.     Deep Tendon Reflexes: Reflexes are normal and symmetric.  Psychiatric:        Mood and Affect: Mood and affect normal.        Behavior: Behavior normal. Behavior is cooperative.        Thought Content: Thought content normal.        Judgment: Judgment normal.        Assessment/Plan: 1. Encounter for routine adult health examination with abnormal findings (Primary) Age-appropriate preventive screenings and vaccinations discussed, annual physical exam completed. Routine labs for health maintenance results discussed with patient today. PHM updated.    2. Other iron deficiency anemia Continue iron supplement as prescribed, repeat iron panel and CBC in 1 month  - ferrous fumarate  (HEMOCYTE - 106 MG FE) 325 (106 Fe) MG TABS tablet; Take 1 tablet (106 mg of iron total) by mouth daily.  Dispense: 30 tablet; Refill: 4 - Iron, TIBC and Ferritin Panel - CBC with Differential/Platelet  3. B12 deficiency Recommend taking OTC B12 supplement 1000 mcg daily   4. Vitamin D deficiency Patient will start OTC vegan vitamin D supplement  5. Elevated low density lipoprotein (LDL) cholesterol level Continue diet and lifestyle modifications as previously discussed       General Counseling: Veronique verbalizes understanding of the findings of todays visit and agrees with plan of treatment. I have discussed any further diagnostic evaluation that may be needed or ordered today. We also reviewed her medications today. she has been encouraged to call the office with any questions or concerns that should arise related to todays visit.    Orders Placed This Encounter  Procedures   Iron, TIBC and Ferritin Panel   CBC with Differential/Platelet    Meds ordered this encounter  Medications   ferrous fumarate (HEMOCYTE - 106 MG FE) 325 (106 Fe) MG TABS tablet    Sig: Take 1 tablet (106 mg of iron total) by mouth daily.    Dispense:  30 tablet    Refill:  4    For future refills, keep on file    Return in about 6 months (around 07/23/2024) for F/U, Kenedi Cilia PCP.   Total time spent:30 Minutes Time spent includes review of chart, medications, test results, and follow up plan with the patient.   [place] Controlled Substance Database was reviewed by me.  This patient was seen by Sallyanne Kuster, FNP-C in collaboration with Dr. Beverely Risen as a part of collaborative care agreement.  Estus Krakowski R. Tedd Sias, MSN, FNP-C Internal medicine

## 2024-01-22 ENCOUNTER — Encounter: Payer: Self-pay | Admitting: Nurse Practitioner

## 2024-05-27 LAB — CBC WITH DIFFERENTIAL/PLATELET
Basophils Absolute: 0 x10E3/uL (ref 0.0–0.2)
Basos: 1 %
EOS (ABSOLUTE): 0.1 x10E3/uL (ref 0.0–0.4)
Eos: 2 %
Hematocrit: 37 % (ref 34.0–46.6)
Hemoglobin: 11.7 g/dL (ref 11.1–15.9)
Immature Grans (Abs): 0 x10E3/uL (ref 0.0–0.1)
Immature Granulocytes: 0 %
Lymphocytes Absolute: 1.5 x10E3/uL (ref 0.7–3.1)
Lymphs: 45 %
MCH: 24.5 pg — ABNORMAL LOW (ref 26.6–33.0)
MCHC: 31.6 g/dL (ref 31.5–35.7)
MCV: 78 fL — ABNORMAL LOW (ref 79–97)
Monocytes Absolute: 0.2 x10E3/uL (ref 0.1–0.9)
Monocytes: 6 %
Neutrophils Absolute: 1.5 x10E3/uL (ref 1.4–7.0)
Neutrophils: 46 %
Platelets: 253 x10E3/uL (ref 150–450)
RBC: 4.77 x10E6/uL (ref 3.77–5.28)
RDW: 15.7 % — ABNORMAL HIGH (ref 11.7–15.4)
WBC: 3.2 x10E3/uL — ABNORMAL LOW (ref 3.4–10.8)

## 2024-05-27 LAB — IRON,TIBC AND FERRITIN PANEL
Ferritin: 23 ng/mL (ref 15–150)
Iron Saturation: 29 % (ref 15–55)
Iron: 121 ug/dL (ref 27–159)
Total Iron Binding Capacity: 422 ug/dL (ref 250–450)
UIBC: 301 ug/dL (ref 131–425)

## 2024-06-09 ENCOUNTER — Ambulatory Visit (INDEPENDENT_AMBULATORY_CARE_PROVIDER_SITE_OTHER): Payer: Self-pay | Admitting: Nurse Practitioner

## 2024-06-09 ENCOUNTER — Encounter: Payer: Self-pay | Admitting: Nurse Practitioner

## 2024-06-09 VITALS — BP 120/66 | HR 81 | Temp 98.2°F | Resp 16 | Ht 62.0 in | Wt 133.2 lb

## 2024-06-09 DIAGNOSIS — L659 Nonscarring hair loss, unspecified: Secondary | ICD-10-CM

## 2024-06-09 DIAGNOSIS — D508 Other iron deficiency anemias: Secondary | ICD-10-CM | POA: Diagnosis not present

## 2024-06-09 DIAGNOSIS — E538 Deficiency of other specified B group vitamins: Secondary | ICD-10-CM

## 2024-06-09 DIAGNOSIS — E559 Vitamin D deficiency, unspecified: Secondary | ICD-10-CM

## 2024-06-09 MED ORDER — VITAMIN D (ERGOCALCIFEROL) 1.25 MG (50000 UNIT) PO CAPS: 50000.0000 [IU] | ORAL_CAPSULE | ORAL | 1 refills | Status: AC

## 2024-06-09 MED ORDER — FERROUS FUMARATE 325 (106 FE) MG PO TABS: 1.0000 | ORAL_TABLET | Freq: Every day | ORAL | 12 refills | Status: DC

## 2024-06-09 NOTE — Progress Notes (Signed)
 [facility] [address] [place], [place] [address]  Internal MEDICINE  Office Visit Note  Patient Name: Isabella Bennett  [id number]  [id number]  Date of Service: 06/09/2024  Chief Complaint  Patient presents with   Depression   Follow-up    Review labs    HPI Morgana presents for a follow-up visit for IDA, low vitamin D , low vitamin B12 and worsening hair loss.  Iron deficiency anemia -- iron panel is stable, she is taking an iron supplement  Vitamin D  deficiency -- taking weekly vitamin D  supplement.  B12 deficiency -- taking OTC B12 supplement.  Hair loss worsening -- thyroid  has been normal.     Current Medication: Outpatient Encounter Medications as of 06/09/2024  Medication Sig   ferrous fumarate  (HEMOCYTE - 106 MG FE) 325 (106 Fe) MG TABS tablet Take 1 tablet (106 mg of iron total) by mouth daily.   Vitamin D , Ergocalciferol , (DRISDOL ) 1.25 MG (50000 UNIT) CAPS capsule Take 1 capsule (50,000 Units total) by mouth every 7 (seven) days.   [DISCONTINUED] ferrous fumarate  (HEMOCYTE - 106 MG FE) 325 (106 Fe) MG TABS tablet Take 1 tablet (106 mg of iron total) by mouth daily.   [DISCONTINUED] Vitamin D , Ergocalciferol , (DRISDOL ) 1.25 MG (50000 UNIT) CAPS capsule Take 1 capsule (50,000 Units total) by mouth every 7 (seven) days.   No facility-administered encounter medications on file as of 06/09/2024.    Surgical History: Past Surgical History:  Procedure Laterality Date   CESAREAN SECTION  07/21/2017   Procedure: CESAREAN SECTION;  Surgeon: Schermerhorn, Debby PARAS, MD;  Location: [facility] ORS;  Service: Obstetrics;;    Medical History: Past Medical History:  Diagnosis Date   Depression    Medical history non-contributory     Family History: Family History  Problem Relation Age of Onset   Liver disease Mother    Kidney Stones Mother    Hypertension Father     Social History   Socioeconomic History   Marital status: Married    Spouse name: Not on file    Number of children: Not on file   Years of education: Not on file   Highest education level: Not on file  Occupational History   Not on file  Tobacco Use   Smoking status: Never    Passive exposure: Never   Smokeless tobacco: Never  Substance and Sexual Activity   Alcohol use: Not Currently   Drug use: No   Sexual activity: Yes  Other Topics Concern   Not on file  Social History Narrative   Not on file   Social Drivers of Health   Financial Resource Strain: Not on file  Food Insecurity: Not on file  Transportation Needs: Not on file  Physical Activity: Not on file  Stress: Not on file  Social Connections: Not on file  Intimate Partner Violence: Not on file      Review of Systems  Constitutional:  Positive for fatigue. Negative for chills and unexpected weight change.  HENT:  Negative for congestion, rhinorrhea, sneezing and sore throat.   Eyes:  Negative for redness.  Respiratory:  Negative for cough, chest tightness and shortness of breath.   Cardiovascular:  Negative for chest pain and palpitations.  Gastrointestinal:  Negative for abdominal pain, constipation, diarrhea, nausea and vomiting.  Genitourinary:  Negative for dysuria and frequency.  Musculoskeletal:  Negative for arthralgias, back pain, joint swelling and neck pain.  Skin:  Negative for rash.  Neurological:  Positive for dizziness and weakness. Negative  for tremors and numbness.  Hematological:  Negative for adenopathy. Does not bruise/bleed easily.  Psychiatric/Behavioral:  Positive for depression. Negative for behavioral problems (Depression), sleep disturbance and suicidal ideas. The patient is not nervous/anxious.     Vital Signs: BP 120/66   Pulse 81   Temp 98.2 F (36.8 C)   Resp 16   Ht 5' 2 (1.575 m)   Wt 133 lb 3.2 oz (60.4 kg)   SpO2 99%   BMI 24.36 kg/m    Physical Exam Vitals reviewed.  Constitutional:      General: She is not in acute distress.    Appearance: Normal  appearance. She is normal weight. She is not ill-appearing.  HENT:     Head: Normocephalic and atraumatic.  Eyes:     Pupils: Pupils are equal, round, and reactive to light.  Cardiovascular:     Rate and Rhythm: Normal rate and regular rhythm.  Pulmonary:     Effort: Pulmonary effort is normal. No respiratory distress.  Neurological:     Mental Status: She is alert and oriented to person, place, and time.  Psychiatric:        Mood and Affect: Mood normal.        Behavior: Behavior normal.        Assessment/Plan: 1. Other iron deficiency anemia (Primary) Continue iron supplement as prescribed.  - ferrous fumarate  (HEMOCYTE - 106 MG FE) 325 (106 Fe) MG TABS tablet; Take 1 tablet (106 mg of iron total) by mouth daily.  Dispense: 30 tablet; Refill: 12  2. Hair loss disorder Repeat labs ordered  - Vitamin D  (25 hydroxy) - B12 and Folate Panel - TSH + free T4  3. B12 deficiency Repeat lab ordered  - B12 and Folate Panel  4. Vitamin D  deficiency Repeat lab ordered and continue weekly vitamin D  supplement.  - Vitamin D , Ergocalciferol , (DRISDOL ) 1.25 MG (50000 UNIT) CAPS capsule; Take 1 capsule (50,000 Units total) by mouth every 7 (seven) days.  Dispense: 12 capsule; Refill: 1 - Vitamin D  (25 hydroxy)   General Counseling: Serrita verbalizes understanding of the findings of todays visit and agrees with plan of treatment. I have discussed any further diagnostic evaluation that may be needed or ordered today. We also reviewed her medications today. she has been encouraged to call the office with any questions or concerns that should arise related to todays visit.    Orders Placed This Encounter  Procedures   Vitamin D  (25 hydroxy)   B12 and Folate Panel   TSH + free T4    Meds ordered this encounter  Medications   Vitamin D , Ergocalciferol , (DRISDOL ) 1.25 MG (50000 UNIT) CAPS capsule    Sig: Take 1 capsule (50,000 Units total) by mouth every 7 (seven) days.    Dispense:   12 capsule    Refill:  1    Please fill now   ferrous fumarate  (HEMOCYTE - 106 MG FE) 325 (106 Fe) MG TABS tablet    Sig: Take 1 tablet (106 mg of iron total) by mouth daily.    Dispense:  30 tablet    Refill:  12    For future refills, keep on file    Return for reschedule september visit for 3 months from now. .   Total time spent:30 Minutes Time spent includes review of chart, medications, test results, and follow up plan with the patient.   [place] Controlled Substance Database was reviewed by me.  This patient was seen by Mardy Maxin,  FNP-C in collaboration with Dr. Sigrid Bathe as a part of collaborative care agreement.   Zahara Rembert R. Liana, MSN, FNP-C Internal medicine

## 2024-07-05 ENCOUNTER — Encounter: Payer: Self-pay | Admitting: Nurse Practitioner

## 2024-07-21 ENCOUNTER — Ambulatory Visit: Payer: Self-pay | Admitting: Nurse Practitioner

## 2024-09-05 ENCOUNTER — Telehealth: Payer: Self-pay

## 2024-09-05 DIAGNOSIS — D508 Other iron deficiency anemias: Secondary | ICD-10-CM

## 2024-09-05 NOTE — Telephone Encounter (Signed)
 Patient notified

## 2024-09-07 ENCOUNTER — Ambulatory Visit: Payer: Self-pay | Admitting: Nurse Practitioner

## 2024-09-07 LAB — CBC WITH DIFFERENTIAL/PLATELET
Basophils Absolute: 0 x10E3/uL (ref 0.0–0.2)
Basos: 1 %
EOS (ABSOLUTE): 0.1 x10E3/uL (ref 0.0–0.4)
Eos: 2 %
Hematocrit: 35.2 % (ref 34.0–46.6)
Hemoglobin: 10.9 g/dL — ABNORMAL LOW (ref 11.1–15.9)
Immature Grans (Abs): 0 x10E3/uL (ref 0.0–0.1)
Immature Granulocytes: 0 %
Lymphocytes Absolute: 1.7 x10E3/uL (ref 0.7–3.1)
Lymphs: 42 %
MCH: 23.8 pg — ABNORMAL LOW (ref 26.6–33.0)
MCHC: 31 g/dL — ABNORMAL LOW (ref 31.5–35.7)
MCV: 77 fL — ABNORMAL LOW (ref 79–97)
Monocytes Absolute: 0.2 x10E3/uL (ref 0.1–0.9)
Monocytes: 6 %
Neutrophils Absolute: 2 x10E3/uL (ref 1.4–7.0)
Neutrophils: 49 %
Platelets: 245 x10E3/uL (ref 150–450)
RBC: 4.58 x10E6/uL (ref 3.77–5.28)
RDW: 13.5 % (ref 11.7–15.4)
WBC: 4.1 x10E3/uL (ref 3.4–10.8)

## 2024-09-07 LAB — IRON,TIBC AND FERRITIN PANEL
Ferritin: 6 ng/mL — ABNORMAL LOW (ref 15–150)
Iron Saturation: 6 % — CL (ref 15–55)
Iron: 26 ug/dL — ABNORMAL LOW (ref 27–159)
Total Iron Binding Capacity: 440 ug/dL (ref 250–450)
UIBC: 414 ug/dL (ref 131–425)

## 2024-09-07 NOTE — Progress Notes (Signed)
 Labs reviewed, will discussed at upcoming appt.

## 2024-09-12 ENCOUNTER — Ambulatory Visit: Payer: Self-pay | Admitting: Nurse Practitioner

## 2024-09-20 ENCOUNTER — Ambulatory Visit: Payer: Self-pay | Admitting: Nurse Practitioner

## 2024-09-20 ENCOUNTER — Encounter: Payer: Self-pay | Admitting: Nurse Practitioner

## 2024-09-20 VITALS — BP 130/82 | HR 60 | Temp 97.3°F | Resp 16 | Ht 62.0 in | Wt 137.6 lb

## 2024-09-20 DIAGNOSIS — E559 Vitamin D deficiency, unspecified: Secondary | ICD-10-CM | POA: Diagnosis not present

## 2024-09-20 DIAGNOSIS — L659 Nonscarring hair loss, unspecified: Secondary | ICD-10-CM | POA: Diagnosis not present

## 2024-09-20 DIAGNOSIS — D508 Other iron deficiency anemias: Secondary | ICD-10-CM

## 2024-09-20 DIAGNOSIS — E538 Deficiency of other specified B group vitamins: Secondary | ICD-10-CM | POA: Diagnosis not present

## 2024-09-20 NOTE — Progress Notes (Signed)
 [facility] [address] [place], [place] [address]  Internal MEDICINE  Office Visit Note  Patient Name: Isabella Bennett  [id number]  [id number]  Date of Service: 09/20/2024  Chief Complaint  Patient presents with   Depression   Follow-up    HPI Isabella Bennett presents for a follow-up visit for lab results and IDA.  IDA -- hemoglobin dropped down again without iron supplement.  Low ferritin -- levels dropped back down, patient tried stopping iron supplement and her levels dropped again.  Vitamin D  deficiency -- her last level was very low at 5.0. her vitamin D  level was not drawn when she had her labs done. Patient reminded that she can go back to get the additional labs drawn anytime. Mild high cholesterol -- LDL 107 in march this year Low vitamin B12 which related to the IDA issues as well.     Current Medication: Outpatient Encounter Medications as of 09/20/2024  Medication Sig   ferrous fumarate  (HEMOCYTE - 106 MG FE) 325 (106 Fe) MG TABS tablet Take 1 tablet (106 mg of iron total) by mouth daily.   Vitamin D , Ergocalciferol , (DRISDOL ) 1.25 MG (50000 UNIT) CAPS capsule Take 1 capsule (50,000 Units total) by mouth every 7 (seven) days.   No facility-administered encounter medications on file as of 09/20/2024.    Surgical History: Past Surgical History:  Procedure Laterality Date   CESAREAN SECTION  07/21/2017   Procedure: CESAREAN SECTION;  Surgeon: Schermerhorn, Debby PARAS, MD;  Location: [facility] ORS;  Service: Obstetrics;;    Medical History: Past Medical History:  Diagnosis Date   Depression    Medical history non-contributory     Family History: Family History  Problem Relation Age of Onset   Liver disease Mother    Kidney Stones Mother    Hypertension Father     Social History   Socioeconomic History   Marital status: Married    Spouse name: Not on file   Number of children: Not on file   Years of education: Not on file   Highest education level: Not  on file  Occupational History   Not on file  Tobacco Use   Smoking status: Never    Passive exposure: Never   Smokeless tobacco: Never  Substance and Sexual Activity   Alcohol use: Not Currently   Drug use: No   Sexual activity: Yes  Other Topics Concern   Not on file  Social History Narrative   Not on file   Social Drivers of Health   Financial Resource Strain: Not on file  Food Insecurity: Not on file  Transportation Needs: Not on file  Physical Activity: Not on file  Stress: Not on file  Social Connections: Not on file  Intimate Partner Violence: Not on file      Review of Systems  Constitutional:  Positive for fatigue. Negative for chills and unexpected weight change.  HENT:  Negative for congestion, rhinorrhea, sneezing and sore throat.   Eyes:  Negative for redness.  Respiratory:  Negative for cough, chest tightness and shortness of breath.   Cardiovascular:  Negative for chest pain and palpitations.  Gastrointestinal:  Negative for abdominal pain, constipation, diarrhea, nausea and vomiting.  Genitourinary:  Negative for dysuria and frequency.  Musculoskeletal:  Negative for arthralgias, back pain, joint swelling and neck pain.  Skin:  Negative for rash.  Neurological:  Positive for dizziness and weakness. Negative for tremors and numbness.  Hematological:  Negative for adenopathy. Does not bruise/bleed easily.  Psychiatric/Behavioral:  Negative for behavioral problems (Depression), sleep disturbance and suicidal ideas. The patient is not nervous/anxious.     Vital Signs: BP 130/82   Pulse 60   Temp (!) 97.3 F (36.3 C)   Resp 16   Ht 5' 2 (1.575 m)   Wt 137 lb 9.6 oz (62.4 kg)   SpO2 99%   BMI 25.17 kg/m    Physical Exam Vitals reviewed.  Constitutional:      General: She is not in acute distress.    Appearance: Normal appearance. She is normal weight. She is not ill-appearing.  HENT:     Head: Normocephalic and atraumatic.  Eyes:     Pupils:  Pupils are equal, round, and reactive to light.  Cardiovascular:     Rate and Rhythm: Normal rate and regular rhythm.  Pulmonary:     Effort: Pulmonary effort is normal. No respiratory distress.  Neurological:     Mental Status: She is alert and oriented to person, place, and time.  Psychiatric:        Mood and Affect: Mood normal.        Behavior: Behavior normal.        Assessment/Plan: 1. Other iron deficiency anemia (Primary) Resume iron supplement as prescribed   2. B12 deficiency Continue OTC oral B12 supplement.   3. Vitamin D  deficiency Continue vitamin D  supplement and patient reminded to get her labs drawn for this level.   4. Hair loss disorder Thyroid  labs ordered and patient reminded to have these labs drawn.    General Counseling: Isabella Bennett verbalizes understanding of the findings of todays visit and agrees with plan of treatment. I have discussed any further diagnostic evaluation that may be needed or ordered today. We also reviewed her medications today. she has been encouraged to call the office with any questions or concerns that should arise related to todays visit.    No orders of the defined types were placed in this encounter.   No orders of the defined types were placed in this encounter.   Return for will call for appt prior to march if needed. .   Total time spent:30 Minutes Time spent includes review of chart, medications, test results, and follow up plan with the patient.   [place] Controlled Substance Database was reviewed by me.  This patient was seen by Mardy Maxin, FNP-C in collaboration with Dr. Sigrid Bathe as a part of collaborative care agreement.   Francena Zender R. Maxin, MSN, FNP-C Internal medicine

## 2024-10-24 ENCOUNTER — Encounter: Payer: Self-pay | Admitting: Nurse Practitioner

## 2024-10-24 ENCOUNTER — Ambulatory Visit: Payer: Self-pay | Admitting: Nurse Practitioner

## 2024-10-24 VITALS — BP 120/86 | HR 69 | Temp 97.6°F | Resp 16 | Ht 62.0 in | Wt 138.2 lb

## 2024-10-24 DIAGNOSIS — D510 Vitamin B12 deficiency anemia due to intrinsic factor deficiency: Secondary | ICD-10-CM

## 2024-10-24 DIAGNOSIS — E611 Iron deficiency: Secondary | ICD-10-CM

## 2024-10-24 DIAGNOSIS — Z3201 Encounter for pregnancy test, result positive: Secondary | ICD-10-CM

## 2024-10-24 LAB — POCT URINE PREGNANCY: Preg Test, Ur: POSITIVE — AB

## 2024-10-24 MED ORDER — FOLIC ACID 1 MG PO TABS: 1.0000 mg | ORAL_TABLET | Freq: Every day | ORAL | 0 refills | Status: AC

## 2024-10-24 NOTE — Progress Notes (Signed)
 [facility] [address] [place], [place] [address]  Internal MEDICINE  Office Visit Note  Patient Name: Isabella Bennett  [id number]  [id number]  Date of Service: 10/24/2024  Chief Complaint  Patient presents with   Acute Visit    Pregnancy test     HPI Isabella Bennett presents for a follow-up visit for probably pregnancy.  Patient had 2 positive urine pregnancy tests at home. Her and her husband have recently started trying to get pregnant again.  --Her LMP was 09/09/2024 --Positive urine pregnancy test today in office  --Approx gestational age is 6 weeks and 3 days  --EDD is 06/16/2025 --This is her second pregnancy.     Current Medication: Outpatient Encounter Medications as of 10/24/2024  Medication Sig   folic acid  (FOLVITE ) 1 MG tablet Take 1 tablet (1 mg total) by mouth daily.   ferrous fumarate  (HEMOCYTE - 106 MG FE) 325 (106 Fe) MG TABS tablet Take 1 tablet (106 mg of iron total) by mouth daily.   Vitamin D , Ergocalciferol , (DRISDOL ) 1.25 MG (50000 UNIT) CAPS capsule Take 1 capsule (50,000 Units total) by mouth every 7 (seven) days.   No facility-administered encounter medications on file as of 10/24/2024.    Surgical History: Past Surgical History:  Procedure Laterality Date   CESAREAN SECTION  07/21/2017   Procedure: CESAREAN SECTION;  Surgeon: Schermerhorn, Debby PARAS, MD;  Location: [facility] ORS;  Service: Obstetrics;;    Medical History: Past Medical History:  Diagnosis Date   Depression    Medical history non-contributory     Family History: Family History  Problem Relation Age of Onset   Liver disease Mother    Kidney Stones Mother    Hypertension Father     Social History   Socioeconomic History   Marital status: Married    Spouse name: Not on file   Number of children: Not on file   Years of education: Not on file   Highest education level: Not on file  Occupational History   Not on file  Tobacco Use   Smoking status: Never    Passive  exposure: Never   Smokeless tobacco: Never  Substance and Sexual Activity   Alcohol use: Not Currently   Drug use: No   Sexual activity: Yes  Other Topics Concern   Not on file  Social History Narrative   Not on file   Social Drivers of Health   Tobacco Use: Low Risk (10/24/2024)   Patient History    Smoking Tobacco Use: Never    Smokeless Tobacco Use: Never    Passive Exposure: Never  Financial Resource Strain: Not on file  Food Insecurity: Not on file  Transportation Needs: Not on file  Physical Activity: Not on file  Stress: Not on file  Social Connections: Not on file  Intimate Partner Violence: Not on file  Depression (PHQ2-9): Low Risk (12/20/2021)   Depression (PHQ2-9)    PHQ-2 Score: 0  Alcohol Screen: Low Risk (03/20/2022)   Alcohol Screen    Last Alcohol Screening Score (AUDIT): 2  Housing: Not on file  Utilities: Not on file  Health Literacy: Not on file      Review of Systems  Constitutional:  Positive for fatigue. Negative for chills and unexpected weight change.  HENT:  Negative for congestion, rhinorrhea, sneezing and sore throat.   Eyes:  Negative for redness.  Respiratory: Negative.  Negative for cough, chest tightness and shortness of breath.   Cardiovascular: Negative.  Negative for chest pain and  palpitations.  Gastrointestinal:  Negative for abdominal pain, constipation, diarrhea, nausea and vomiting.  Genitourinary:  Negative for dysuria and frequency.  Musculoskeletal:  Negative for arthralgias, back pain, joint swelling and neck pain.  Skin:  Negative for rash.  Neurological:  Negative for dizziness, tremors, weakness and numbness.  Hematological:  Negative for adenopathy. Does not bruise/bleed easily.  Psychiatric/Behavioral:  Negative for behavioral problems (Depression), sleep disturbance and suicidal ideas. The patient is not nervous/anxious.     Vital Signs: BP 120/86   Pulse 69   Temp 97.6 F (36.4 C)   Resp 16   Ht 5' 2 (1.575  m)   Wt 138 lb 3.2 oz (62.7 kg)   SpO2 99%   BMI 25.28 kg/m    Physical Exam Vitals reviewed.  Constitutional:      General: She is not in acute distress.    Appearance: Normal appearance. She is normal weight. She is not ill-appearing.  HENT:     Head: Normocephalic and atraumatic.  Eyes:     Pupils: Pupils are equal, round, and reactive to light.  Cardiovascular:     Rate and Rhythm: Normal rate and regular rhythm.     Heart sounds: Normal heart sounds.  Pulmonary:     Effort: Pulmonary effort is normal. No respiratory distress.     Breath sounds: Normal breath sounds. No wheezing.  Skin:    General: Skin is warm and dry.     Capillary Refill: Capillary refill takes less than 2 seconds.  Neurological:     Mental Status: She is alert and oriented to person, place, and time.  Psychiatric:        Mood and Affect: Mood normal.        Behavior: Behavior normal.        Assessment/Plan: 1. Pernicious anemia (Primary) Continue iron and B12 supplement as discussed   2. Dietary iron deficiency Continue iron supplement.   3. Positive urine pregnancy test Urine pregnancy test is positive in office today. Start folic acid  1 mg daily. Beta hcg test ordered. Referred to OBGYN for prenatal care.  - POCT urine pregnancy - Beta HCG, Quant - folic acid  (FOLVITE ) 1 MG tablet; Take 1 tablet (1 mg total) by mouth daily.  Dispense: 90 tablet; Refill: 0 - Ambulatory referral to Obstetrics / Gynecology   General Counseling: Isabella Bennett verbalizes understanding of the findings of todays visit and agrees with plan of treatment. I have discussed any further diagnostic evaluation that may be needed or ordered today. We also reviewed her medications today. she has been encouraged to call the office with any questions or concerns that should arise related to todays visit.    Orders Placed This Encounter  Procedures   Beta HCG, Quant   Ambulatory referral to Obstetrics / Gynecology   POCT  urine pregnancy    Meds ordered this encounter  Medications   folic acid  (FOLVITE ) 1 MG tablet    Sig: Take 1 tablet (1 mg total) by mouth daily.    Dispense:  90 tablet    Refill:  0    Fill new script today    Return for pregnant, referred to [facility] clinic OBGYN.   Total time spent:30 Minutes Time spent includes review of chart, medications, test results, and follow up plan with the patient.   [place] Controlled Substance Database was reviewed by me.  This patient was seen by Mardy Maxin, FNP-C in collaboration with Dr. Sigrid Bathe as a part of collaborative care agreement.  Zayn Selley R. Liana, MSN, FNP-C Internal medicine

## 2024-10-25 LAB — BETA HCG QUANT (REF LAB): hCG Quant: 45278 m[IU]/mL

## 2024-10-28 ENCOUNTER — Encounter: Payer: Self-pay | Admitting: Nurse Practitioner

## 2024-11-11 NOTE — Progress Notes (Unsigned)
 New OB Intake  I explained I am completing New OB Intake today. We discussed her EDD of 06/16/25 that is based on LMP of 09/09/24. Pt is G2/P1001. I reviewed her allergies, medications, Medical/Surgical/OB history, and appropriate screenings. There are cats in the home: No. Based on history, this is a/an pregnancy uncomplicated . Her obstetrical history is significant for NA.  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 Patient was also diagnosed with flu a few days ago, currently still on Tamiflu. Informed patient of our sick policy and rescheduled for a video visit for 4:15pm today. Patient verbalized understanding.     Mathis LITTIE Getting, CMA 11/14/2024  10:52 AM

## 2024-11-14 ENCOUNTER — Telehealth: Payer: Self-pay

## 2024-11-14 ENCOUNTER — Ambulatory Visit: Payer: Self-pay

## 2024-11-14 DIAGNOSIS — Z3687 Encounter for antenatal screening for uncertain dates: Secondary | ICD-10-CM

## 2024-11-14 DIAGNOSIS — Z3480 Encounter for supervision of other normal pregnancy, unspecified trimester: Secondary | ICD-10-CM | POA: Insufficient documentation

## 2024-11-14 NOTE — Progress Notes (Signed)
 New OB Intake  I connected with  Isabella Bennett on 11/14/2024 at  4:15 PM EST by MyChart Video Visit and verified that I am speaking with the correct person using two identifiers. Nurse is located at [facility] and pt is located at Home.  I discussed the limitations, risks, security and privacy concerns of performing an evaluation and management service by telephone and the availability of in person appointments. I also discussed with the patient that there may be a patient responsible charge related to this service. The patient expressed understanding and agreed to proceed.  I explained I am completing New OB Intake today. We discussed her EDD of 06/16/25 that is based on LMP of 09/09/24. Pt is G2/P1001. I reviewed her allergies, medications, Medical/Surgical/OB history, and appropriate screenings. There are cats in the home: No. Based on history, this is a/an pregnancy uncomplicated . Her obstetrical history is significant for NA.  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  Anatomy US  Explained first scheduled US  will be 11/22/24. Anatomy US  will be scheduled around [redacted] weeks gestational age.  Labs Discussed genetic screening with patient. Patient declines genetic testing to be drawn at new OB visit. Discussed possible labs to be drawn at new OB appointment.  COVID Vaccine Patient has had COVID vaccine.   Social  Determinants of Health Food Insecurity: denies food insecurity Transportation: Patient denies transportation needs. Childcare: Discussed no children allowed at ultrasound appointments.   First visit review I reviewed new OB appt with pt. I explained she will have blood work and pap smear/pelvic exam if indicated. Explained pt will be seen by Lauraine Lakes, CNM at first visit; encounter routed to appropriate provider.   Mathis LITTIE Getting, CMA 11/14/2024  4:13 PM

## 2024-11-14 NOTE — Patient Instructions (Signed)
 First Trimester of Pregnancy  The first trimester of pregnancy starts on the first day of your last monthly period until the end of week 13. This is months 1 through 3 of pregnancy. A week after a sperm fertilizes an egg, the egg will implant into the wall of the uterus and begin to develop into a baby. Body changes during your first trimester Your body goes through many changes during pregnancy. The changes usually return to normal after your baby is born. Physical changes Your breasts may grow larger and may hurt. The area around your nipples may get darker. Your periods will stop. Your hair and nails may grow faster. You may pee more often. Health changes You may tire easily. Your gums may bleed and may be sensitive when you brush and floss. You may not feel hungry. You may have heartburn. You may throw up or feel like you may throw up. You may want to eat some foods, but not others. You may have headaches. You may have trouble pooping (constipation). Other changes Your emotions may change from day to day. You may have more dreams. Follow these instructions at home: Medicines Talk to your health care provider if you're taking medicines. Ask if the medicines are safe to take during pregnancy. Your provider may change the medicines that you take. Do not take any medicines unless told to by your provider. Take a prenatal vitamin that has at least 600 micrograms (mcg) of folic acid. Do not use herbal medicines, illegal substances, or medicines that are not approved by your provider. Eating and drinking While you're pregnant your body needs extra food for your growing baby. Talk with your provider about what to eat while pregnant. Activity Most women are able to exercise during pregnancy. Exercises may need to change as your pregnancy goes on. Talk to your provider about your activities and exercise routines. Relieving pain and discomfort Wear a good, supportive bra if your breasts  hurt. Rest with your legs raised if you have leg cramps or low back pain. Safety Wear your seatbelt at all times when you're in a car. Talk to your provider if someone hits you, hurts you, or yells at you. Talk with your provider if you're feeling sad or have thoughts of hurting yourself. Lifestyle Certain things can be harmful while you're pregnant. Follow these rules: Do not use hot tubs, steam rooms, or saunas. Do not douche. Do not use tampons or scented pads. Do not drink alcohol,smoke, vape, or use products with nicotine or tobacco in them. If you need help quitting, talk with your provider. Avoid cat litter boxes and soil used by cats. These things carry germs that can cause harm to your pregnancy and your baby. General instructions Keep all follow-up visits. It helps you and your unborn baby stay as healthy as possible. Write down your questions. Take them to your visits. Your provider will: Talk with you about your overall health. Give you advice or refer you to specialists who can help with different needs, including: Prenatal education classes. Mental health and counseling. Foods and healthy eating. Ask for help if you need help with food. Call your dentist and ask to be seen. Brush your teeth with a soft toothbrush. Floss gently. Where to find more information American Pregnancy Association: americanpregnancy.org [employer]: acog.org Office on [employer] Health: [employer] Contact a health care provider if: You feel dizzy, faint, or have a fever. You vomit or have watery poop (diarrhea) for 2  days or more. You have abnormal discharge or bleeding from your vagina. You have pain when you pee or your pee smells bad. You have cramps, pain, or pressure in your belly area. Get help right away if: You have trouble breathing or chest pain. You have any kind of injury, such as from a fall or a car crash. These symptoms may be an  emergency. Get help right away. Call 911. Do not wait to see if the symptoms will go away. Do not drive yourself to the hospital. This information is not intended to replace advice given to you by your health care provider. Make sure you discuss any questions you have with your health care provider. Document Revised: 07/23/2023 Document Reviewed: 02/20/2023 Elsevier Patient Education  2024 [employer]Commonly Asked Questions During Pregnancy  Cats: A parasite can be excreted in cat feces.  To avoid exposure you need to have another person empty the little box.  If you must empty the litter box you will need to wear gloves.  Wash your hands after handling your cat.  This parasite can also be found in raw or undercooked meat so this should also be avoided.  Colds, Sore Throats, Flu: Please check your medication sheet to see what you can take for symptoms.  If your symptoms are unrelieved by these medications please call the office.  Dental Work: Most any dental work Agricultural consultant recommends is permitted.  X-rays should only be taken during the first trimester if absolutely necessary.  Your abdomen should be shielded with a lead apron during all x-rays.  Please notify your provider prior to receiving any x-rays.  Novocaine is fine; gas is not recommended.  If your dentist requires a note from [place] prior to dental work please call the office and we will provide one for you.  Exercise: Exercise is an important part of staying healthy during your pregnancy.  You may continue most exercises you were accustomed to prior to pregnancy.  Later in your pregnancy you will most likely notice you have difficulty with activities requiring balance like riding a bicycle.  It is important that you listen to your body and avoid activities that put you at a higher risk of falling.  Adequate rest and staying well hydrated are a must!  If you have questions about the safety of specific activities ask your provider.     Exposure to Children with illness: Try to avoid obvious exposure; report any symptoms to [place] when noted,  If you have chicken pos, red measles or mumps, you should be immune to these diseases.   Please do not take any vaccines while pregnant unless you have checked with your OB provider.  Fetal Movement: After 28 weeks we recommend you do "kick counts" twice daily.  Lie or sit down in a calm quiet environment and count your baby movements "kicks".  You should feel your baby at least 10 times per hour.  If you have not felt 10 kicks within the first hour get up, walk around and have something sweet to eat or drink then repeat for an additional hour.  If count remains less than 10 per hour notify your provider.  Fumigating: Follow your pest control agent's advice as to how long to stay out of your home.  Ventilate the area well before re-entering.  Hemorrhoids:   Most over-the-counter preparations can be used during pregnancy.  Check your medication to see what is safe to use.  It is important to use a  stool softener or fiber in your diet and to drink lots of liquids.  If hemorrhoids seem to be getting worse please call the office.   Hot Tubs:  Hot tubs Jacuzzis and saunas are not recommended while pregnant.  These increase your internal body temperature and should be avoided.  Intercourse:  Sexual intercourse is safe during pregnancy as long as you are comfortable, unless otherwise advised by your provider.  Spotting may occur after intercourse; report any bright red bleeding that is heavier than spotting.  Labor:  If you know that you are in labor, please go to the hospital.  If you are unsure, please call the office and let us help you decide what to do.  Lifting, straining, etc:  If your job requires heavy lifting or straining please check with your provider for any limitations.  Generally, you should not lift items heavier than that you can lift simply with your hands and arms (no back  muscles)  Painting:  Paint fumes do not harm your pregnancy, but may make you ill and should be avoided if possible.  Latex or water based paints have less odor than oils.  Use adequate ventilation while painting.  Permanents & Hair Color:  Chemicals in hair dyes are not recommended as they cause increase hair dryness which can increase hair loss during pregnancy.  " Highlighting" and permanents are allowed.  Dye may be absorbed differently and permanents may not hold as well during pregnancy.  Sunbathing:  Use a sunscreen, as skin burns easily during pregnancy.  Drink plenty of fluids; avoid over heating.  Tanning Beds:  Because their possible side effects are still unknown, tanning beds are not recommended.  Ultrasound Scans:  Routine ultrasounds are performed at approximately 20 weeks.  You will be able to see your baby's general anatomy an if you would like to know the gender this can usually be determined as well.  If it is questionable when you conceived you may also receive an ultrasound early in your pregnancy for dating purposes.  Otherwise ultrasound exams are not routinely performed unless there is a medical necessity.  Although you can request a scan we ask that you pay for it when conducted because insurance does not cover " patient request" scans.  Work: If your pregnancy proceeds without complications you may work until your due date, unless your physician or employer advises otherwise.  Round Ligament Pain/Pelvic Discomfort:  Sharp, shooting pains not associated with bleeding are fairly common, usually occurring in the second trimester of pregnancy.  They tend to be worse when standing up or when you remain standing for long periods of time.  These are the result of pressure of certain pelvic ligaments called "round ligaments".  Rest, Tylenol and heat seem to be the most effective relief.  As the womb and fetus grow, they rise out of the pelvis and the discomfort improves.  Please  notify the office if your pain seems different than that described.  It may represent a more serious condition.  Common Medications Safe in Pregnancy  Acne:      Constipation:  Benzoyl Peroxide     Colace  Clindamycin      Dulcolax Suppository  Topica Erythromycin     Fibercon  Salicylic Acid      Metamucil         Miralax AVOID:        Senakot   Accutane    Cough:  Retin-A       Cough  Drops  Tetracycline      Phenergan w/ Codeine if Rx  Minocycline      Robitussin (Plain & DM)  Antibiotics:     Crabs/Lice:  Ceclor       RID  Cephalosporins    AVOID:  E-Mycins      Kwell  Keflex  Macrobid/Macrodantin   Diarrhea:  Penicillin      Kao-Pectate  Zithromax      Imodium AD         PUSH FLUIDS AVOID:       Cipro     Fever:  Tetracycline      Tylenol (Regular or Extra  Minocycline       Strength)  Levaquin      Extra Strength-Do not          Exceed 8 tabs/24 hrs Caffeine:        200mg /day (equiv. To 1 cup of coffee or  approx. 3 12 oz sodas)         Gas: Cold/Hayfever:       Gas-X  Benadryl      Mylicon  Claritin       Phazyme  **Claritin-D        Chlor-Trimeton    Headaches:  Dimetapp      ASA-Free Excedrin  Drixoral-Non-Drowsy     Cold Compress  Mucinex (Guaifenasin)     Tylenol (Regular or Extra  Sudafed/Sudafed-12 Hour     Strength)  **Sudafed PE Pseudoephedrine   Tylenol Cold & Sinus     Vicks Vapor Rub  Zyrtec  **AVOID if Problems With Blood Pressure         Heartburn: Avoid lying down for at least 1 hour after meals  Aciphex      Maalox     Rash:  Milk of Magnesia     Benadryl    Mylanta       1% Hydrocortisone Cream  Pepcid  Pepcid Complete   Sleep Aids:  Prevacid      Ambien   Prilosec       Benadryl  Rolaids       Chamomile Tea  Tums (Limit 4/day)     Unisom         Tylenol PM         Warm milk-add vanilla or  Hemorrhoids:       Sugar for taste  Anusol/Anusol H.C.  (RX: Analapram 2.5%)  Sugar Substitutes:  Hydrocortisone OTC     Ok in  moderation  Preparation H      Tucks        Vaseline lotion applied to tissue with wiping    Herpes:     Throat:  Acyclovir      Oragel  Famvir  Valtrex     Vaccines:         Flu Shot Leg Cramps:       *Gardasil  Benadryl      Hepatitis A         Hepatitis B Nasal Spray:       Pneumovax  Saline Nasal Spray     Polio Booster         Tetanus Nausea:       Tuberculosis test or PPD  Vitamin B6 25 mg TID   AVOID:    Dramamine      *Gardasil  Emetrol       Live Poliovirus  Ginger Root 250 mg QID    MMR (measles, mumps &  High  Complex Carbs @ Bedtime    rebella)  Sea Bands-Accupressure    Varicella (Chickenpox)  Unisom 1/2 tab TID     *No known complications           If received before Pain:         Known pregnancy;   Darvocet       Resume series after  Lortab        Delivery  Percocet    Yeast:   Tramadol      Femstat  Tylenol 3      Gyne-lotrimin  Ultram       Monistat  Vicodin           MISC:         All Sunscreens           Hair Coloring/highlights          Insect Repellant's          (Including DEET)         Mystic Tans

## 2024-11-14 NOTE — Patient Instructions (Signed)
 First Trimester of Pregnancy  The first trimester of pregnancy starts on the first day of your last monthly period until the end of week 13. This is months 1 through 3 of pregnancy. A week after a sperm fertilizes an egg, the egg will implant into the wall of the uterus and begin to develop into a baby. Body changes during your first trimester Your body goes through many changes during pregnancy. The changes usually return to normal after your baby is born. Physical changes Your breasts may grow larger and may hurt. The area around your nipples may get darker. Your periods will stop. Your hair and nails may grow faster. You may pee more often. Health changes You may tire easily. Your gums may bleed and may be sensitive when you brush and floss. You may not feel hungry. You may have heartburn. You may throw up or feel like you may throw up. You may want to eat some foods, but not others. You may have headaches. You may have trouble pooping (constipation). Other changes Your emotions may change from day to day. You may have more dreams. Follow these instructions at home: Medicines Talk to your health care provider if you're taking medicines. Ask if the medicines are safe to take during pregnancy. Your provider may change the medicines that you take. Do not take any medicines unless told to by your provider. Take a prenatal vitamin that has at least 600 micrograms (mcg) of folic acid. Do not use herbal medicines, illegal substances, or medicines that are not approved by your provider. Eating and drinking While you're pregnant your body needs extra food for your growing baby. Talk with your provider about what to eat while pregnant. Activity Most women are able to exercise during pregnancy. Exercises may need to change as your pregnancy goes on. Talk to your provider about your activities and exercise routines. Relieving pain and discomfort Wear a good, supportive bra if your breasts  hurt. Rest with your legs raised if you have leg cramps or low back pain. Safety Wear your seatbelt at all times when you're in a car. Talk to your provider if someone hits you, hurts you, or yells at you. Talk with your provider if you're feeling sad or have thoughts of hurting yourself. Lifestyle Certain things can be harmful while you're pregnant. Follow these rules: Do not use hot tubs, steam rooms, or saunas. Do not douche. Do not use tampons or scented pads. Do not drink alcohol,smoke, vape, or use products with nicotine or tobacco in them. If you need help quitting, talk with your provider. Avoid cat litter boxes and soil used by cats. These things carry germs that can cause harm to your pregnancy and your baby. General instructions Keep all follow-up visits. It helps you and your unborn baby stay as healthy as possible. Write down your questions. Take them to your visits. Your provider will: Talk with you about your overall health. Give you advice or refer you to specialists who can help with different needs, including: Prenatal education classes. Mental health and counseling. Foods and healthy eating. Ask for help if you need help with food. Call your dentist and ask to be seen. Brush your teeth with a soft toothbrush. Floss gently. Where to find more information American Pregnancy Association: americanpregnancy.org [employer]: acog.org Office on [employer] Health: [employer] Contact a health care provider if: You feel dizzy, faint, or have a fever. You vomit or have watery poop (diarrhea) for 2  days or more. You have abnormal discharge or bleeding from your vagina. You have pain when you pee or your pee smells bad. You have cramps, pain, or pressure in your belly area. Get help right away if: You have trouble breathing or chest pain. You have any kind of injury, such as from a fall or a car crash. These symptoms may be an  emergency. Get help right away. Call 911. Do not wait to see if the symptoms will go away. Do not drive yourself to the hospital. This information is not intended to replace advice given to you by your health care provider. Make sure you discuss any questions you have with your health care provider. Document Revised: 07/23/2023 Document Reviewed: 02/20/2023 Elsevier Patient Education  2024 [employer]   Common Medications Safe in Pregnancy  Acne:      Constipation:  Benzoyl Peroxide     Colace  Clindamycin      Dulcolax Suppository  Topica Erythromycin     Fibercon  Salicylic Acid      Metamucil         Miralax AVOID:        Senakot   Accutane    Cough:  Retin-A       Cough Drops  Tetracycline      Phenergan w/ Codeine if Rx  Minocycline      Robitussin (Plain & DM)  Antibiotics:     Crabs/Lice:  Ceclor       RID  Cephalosporins    AVOID:  E-Mycins      Kwell  Keflex  Macrobid/Macrodantin   Diarrhea:  Penicillin      Kao-Pectate  Zithromax      Imodium AD         PUSH FLUIDS AVOID:       Cipro     Fever:  Tetracycline      Tylenol (Regular or Extra  Minocycline       Strength)  Levaquin      Extra Strength-Do not          Exceed 8 tabs/24 hrs Caffeine:        200mg /day (equiv. To 1 cup of coffee or  approx. 3 12 oz sodas)         Gas: Cold/Hayfever:       Gas-X  Benadryl      Mylicon  Claritin       Phazyme  **Claritin-D        Chlor-Trimeton    Headaches:  Dimetapp      ASA-Free Excedrin  Drixoral-Non-Drowsy     Cold Compress  Mucinex (Guaifenasin)     Tylenol (Regular or Extra  Sudafed/Sudafed-12 Hour     Strength)  **Sudafed PE Pseudoephedrine   Tylenol Cold & Sinus     Vicks Vapor Rub  Zyrtec  **AVOID if Problems With Blood Pressure         Heartburn: Avoid lying down for at least 1 hour after meals  Aciphex      Maalox     Rash:  Milk of Magnesia     Benadryl    Mylanta       1% Hydrocortisone Cream  Pepcid  Pepcid Complete   Sleep  Aids:  Prevacid      Ambien   Prilosec       Benadryl  Rolaids       Chamomile Tea  Tums (Limit 4/day)     Unisom  Tylenol PM         Warm milk-add vanilla or  Hemorrhoids:       Sugar for taste  Anusol/Anusol H.C.  (RX: Analapram 2.5%)  Sugar Substitutes:  Hydrocortisone OTC     Ok in moderation  Preparation H      Tucks        Vaseline lotion applied to tissue with wiping    Herpes:     Throat:  Acyclovir      Oragel  Famvir  Valtrex     Vaccines:         Flu Shot Leg Cramps:       *Gardasil  Benadryl      Hepatitis A         Hepatitis B Nasal Spray:       Pneumovax  Saline Nasal Spray     Polio Booster         Tetanus Nausea:       Tuberculosis test or PPD  Vitamin B6 25 mg TID   AVOID:    Dramamine      *Gardasil  Emetrol       Live Poliovirus  Ginger Root 250 mg QID    MMR (measles, mumps &  High Complex Carbs @ Bedtime    rebella)  Sea Bands-Accupressure    Varicella (Chickenpox)  Unisom 1/2 tab TID     *No known complications           If received before Pain:         Known pregnancy;   Darvocet       Resume series after  Lortab        Delivery  Percocet    Yeast:   Tramadol      Femstat  Tylenol 3      Gyne-lotrimin  Ultram       Monistat  Vicodin           MISC:         All Sunscreens           Hair Coloring/highlights          Insect Repellant's          (Including DEET)         Mystic Tans   Commonly Asked Questions During Pregnancy   Cats: A parasite can be excreted in cat feces.  To avoid exposure you need to have another person empty the little box.  If you must empty the litter box you will need to wear gloves.  Wash your hands after handling your cat.  This parasite can also be found in raw or undercooked meat so this should also be avoided.  Colds, Sore Throats, Flu: Please check your medication sheet to see what you can take for symptoms.  If your symptoms are unrelieved by these medications please call the office.  Dental Work: Most  any dental work Agricultural consultant recommends is permitted.  X-rays should only be taken during the first trimester if absolutely necessary.  Your abdomen should be shielded with a lead apron during all x-rays.  Please notify your provider prior to receiving any x-rays.  Novocaine is fine; gas is not recommended.  If your dentist requires a note from [place] prior to dental work please call the office and we will provide one for you.  Exercise: Exercise is an important part of staying healthy during your pregnancy.  You may continue most exercises you were accustomed to prior to pregnancy.  Later in your pregnancy you will most likely notice you have difficulty with activities requiring balance like riding a bicycle.  It is important that you listen to your body and avoid activities that put you at a higher risk of falling.  Adequate rest and staying well hydrated are a must!  If you have questions about the safety of specific activities ask your provider.    Exposure to Children with illness: Try to avoid obvious exposure; report any symptoms to [place] when noted,  If you have chicken pos, red measles or mumps, you should be immune to these diseases.   Please do not take any vaccines while pregnant unless you have checked with your OB provider.  Fetal Movement: After 28 weeks we recommend you do "kick counts" twice daily.  Lie or sit down in a calm quiet environment and count your baby movements "kicks".  You should feel your baby at least 10 times per hour.  If you have not felt 10 kicks within the first hour get up, walk around and have something sweet to eat or drink then repeat for an additional hour.  If count remains less than 10 per hour notify your provider.  Fumigating: Follow your pest control agent's advice as to how long to stay out of your home.  Ventilate the area well before re-entering.  Hemorrhoids:   Most over-the-counter preparations can be used during pregnancy.  Check your medication to see what is  safe to use.  It is important to use a stool softener or fiber in your diet and to drink lots of liquids.  If hemorrhoids seem to be getting worse please call the office.   Hot Tubs:  Hot tubs Jacuzzis and saunas are not recommended while pregnant.  These increase your internal body temperature and should be avoided.  Intercourse:  Sexual intercourse is safe during pregnancy as long as you are comfortable, unless otherwise advised by your provider.  Spotting may occur after intercourse; report any bright red bleeding that is heavier than spotting.  Labor:  If you know that you are in labor, please go to the hospital.  If you are unsure, please call the office and let us help you decide what to do.  Lifting, straining, etc:  If your job requires heavy lifting or straining please check with your provider for any limitations.  Generally, you should not lift items heavier than that you can lift simply with your hands and arms (no back muscles)  Painting:  Paint fumes do not harm your pregnancy, but may make you ill and should be avoided if possible.  Latex or water based paints have less odor than oils.  Use adequate ventilation while painting.  Permanents & Hair Color:  Chemicals in hair dyes are not recommended as they cause increase hair dryness which can increase hair loss during pregnancy.  " Highlighting" and permanents are allowed.  Dye may be absorbed differently and permanents may not hold as well during pregnancy.  Sunbathing:  Use a sunscreen, as skin burns easily during pregnancy.  Drink plenty of fluids; avoid over heating.  Tanning Beds:  Because their possible side effects are still unknown, tanning beds are not recommended.  Ultrasound Scans:  Routine ultrasounds are performed at approximately 20 weeks.  You will be able to see your baby's general anatomy an if you would like to know the gender this can usually be determined as well.  If it is questionable when you conceived you may  also  receive an ultrasound early in your pregnancy for dating purposes.  Otherwise ultrasound exams are not routinely performed unless there is a medical necessity.  Although you can request a scan we ask that you pay for it when conducted because insurance does not cover " patient request" scans.  Work: If your pregnancy proceeds without complications you may work until your due date, unless your physician or employer advises otherwise.  Round Ligament Pain/Pelvic Discomfort:  Sharp, shooting pains not associated with bleeding are fairly common, usually occurring in the second trimester of pregnancy.  They tend to be worse when standing up or when you remain standing for long periods of time.  These are the result of pressure of certain pelvic ligaments called "round ligaments".  Rest, Tylenol and heat seem to be the most effective relief.  As the womb and fetus grow, they rise out of the pelvis and the discomfort improves.  Please notify the office if your pain seems different than that described.  It may represent a more serious condition.

## 2024-11-22 ENCOUNTER — Ambulatory Visit (INDEPENDENT_AMBULATORY_CARE_PROVIDER_SITE_OTHER): Payer: Self-pay

## 2024-11-22 DIAGNOSIS — Z3A10 10 weeks gestation of pregnancy: Secondary | ICD-10-CM

## 2024-11-22 DIAGNOSIS — Z3687 Encounter for antenatal screening for uncertain dates: Secondary | ICD-10-CM

## 2024-12-06 ENCOUNTER — Encounter: Payer: Self-pay | Admitting: Registered Nurse

## 2024-12-06 ENCOUNTER — Ambulatory Visit: Admitting: Registered Nurse

## 2024-12-06 ENCOUNTER — Other Ambulatory Visit (HOSPITAL_COMMUNITY): Admission: RE | Admit: 2024-12-06 | Discharge: 2024-12-06 | Disposition: A | Source: Ambulatory Visit

## 2024-12-06 VITALS — BP 90/63 | HR 69 | Wt 136.8 lb

## 2024-12-06 DIAGNOSIS — D510 Vitamin B12 deficiency anemia due to intrinsic factor deficiency: Secondary | ICD-10-CM

## 2024-12-06 DIAGNOSIS — O09299 Supervision of pregnancy with other poor reproductive or obstetric history, unspecified trimester: Secondary | ICD-10-CM

## 2024-12-06 DIAGNOSIS — Z130 Encounter for screening for diseases of the blood and blood-forming organs and certain disorders involving the immune mechanism: Secondary | ICD-10-CM

## 2024-12-06 DIAGNOSIS — Z6791 Unspecified blood type, Rh negative: Secondary | ICD-10-CM

## 2024-12-06 DIAGNOSIS — Z3481 Encounter for supervision of other normal pregnancy, first trimester: Secondary | ICD-10-CM

## 2024-12-06 DIAGNOSIS — L659 Nonscarring hair loss, unspecified: Secondary | ICD-10-CM

## 2024-12-06 DIAGNOSIS — Z113 Encounter for screening for infections with a predominantly sexual mode of transmission: Secondary | ICD-10-CM | POA: Insufficient documentation

## 2024-12-06 DIAGNOSIS — Z0184 Encounter for antibody response examination: Secondary | ICD-10-CM | POA: Insufficient documentation

## 2024-12-06 DIAGNOSIS — Z3480 Encounter for supervision of other normal pregnancy, unspecified trimester: Secondary | ICD-10-CM

## 2024-12-06 DIAGNOSIS — Z1379 Encounter for other screening for genetic and chromosomal anomalies: Secondary | ICD-10-CM

## 2024-12-06 DIAGNOSIS — Z3A12 12 weeks gestation of pregnancy: Secondary | ICD-10-CM

## 2024-12-06 DIAGNOSIS — Z0283 Encounter for blood-alcohol and blood-drug test: Secondary | ICD-10-CM

## 2024-12-06 NOTE — Assessment & Plan Note (Signed)
 B12 level today with NOB labs

## 2024-12-06 NOTE — Patient Instructions (Signed)
 Second Trimester of Pregnancy: What to Know  The second trimester of pregnancy is from week 14 through week 27. This is months 4 through 6 of pregnancy. During the second trimester: Morning sickness is less or has stopped. You may have more energy. You may feel hungry more often. At this time, your unborn baby is growing very fast. At the end of the sixth month, the unborn baby may be up to 12 inches long and weigh about 1 pounds. You will likely start to feel the baby move between 16 and 20 weeks of pregnancy. Body changes during your second trimester Your body continues to change during this time. The changes usually go away after your baby is born. Physical changes You will gain more weight. Your belly will get bigger. You may begin to get stretch marks on your hips, belly, and breasts. Your breasts will keep growing and may hurt. You may get dark spots or blotches on your face. A dark line from your belly button to the pubic area may appear. This line is called linea nigra. Your hair may grow faster and get thicker. Health changes You may have headaches. You may have heartburn. You may pee more often. You may have swollen, bulging veins (varicose veins). You may have trouble pooping (constipation), or swollen veins in the butt that can itch or get painful (hemorrhoids). You may have back pain. This is caused by: Weight gain. Pregnancy hormones that are relaxing the joints in your pelvis. Follow these instructions at home: Medicines Talk to your health care provider if you're taking medicines. Ask if the medicines are safe to take during pregnancy. Your provider may change the medicines that you take. Do not take any medicines unless told to by your provider. Take a prenatal vitamin that has at least 600 micrograms (mcg) of folic acid. Do not use herbal medicines, illegal drugs, or medicines that are not approved by your provider. Eating and drinking While you're pregnant your  body needs extra food for your growing baby. Talk with your provider about what to eat while pregnant. Activity Most women are able to exercise during pregnancy. Exercises may need to change as your pregnancy goes on. Talk to your provider about your activities and exercise routines. Relieving pain and discomfort Wear a good, supportive bra if your breasts hurt. Rest with your legs raised if you have leg cramps or low back pain. Take warm sitz baths to soothe pain from hemorrhoids. Use hemorrhoid cream if your provider says it's okay. Do not douche. Do not use tampons or scented pads. Do not use hot tubs, steam rooms, or saunas. Safety Wear your seatbelt at all times when you're in a car. Talk to your provider if someone hits you, hurts you, or yells at you. Talk with your provider if you're feeling sad or have thoughts of hurting yourself. Lifestyle Certain things can be harmful while you're pregnant. It's best to avoid the following: Do not drink alcohol,smoke, vape, or use products with nicotine or tobacco in them. If you need help quitting, talk with your provider. Avoid cat litter boxes and soil used by cats. These things carry germs that can cause harm to your pregnancy and your baby. General instructions Keep all follow-up visits. It helps you and your unborn baby stay as healthy as possible. Write down your questions. Take them to your prenatal visits. Your provider will: Talk with you about your overall health. Give you advice or refer you to specialists who can help  with different needs, including: Prenatal education classes. Mental health and counseling. Foods and healthy eating. Ask for help if you need help with food. Where to find more information American Pregnancy Association: americanpregnancy.org [employer]: acog.org Office on [employer] Health: [place].ca Contact a health care provider if: You have a headache that does not  go away when you take medicine. You have any of these problems: You can't eat or drink. You throw up or feel like you may throw up. You have watery poop (diarrhea) for 2 days or more. You have pain when you pee or your pee smells bad. You have been sick for 2 days or more and are not getting better. Contact your provider right away if: You have any of these coming from your vagina: Abnormal discharge. Bad-smelling fluid. Bleeding. Your baby is moving less than usual. You have contractions, belly cramping, or have pain in your pelvis or lower back. You have symptoms of high blood pressure or preeclampsia. These include: A severe, throbbing headache that does not go away. Sudden or extreme swelling of your face, hands, legs, or feet. Vision problems: You see spots. You have blurry vision. Your eyes are sensitive to light. If you can't reach the provider, go to an urgent care or emergency room. Get help right away if: You faint, become confused, or can't think clearly. You have chest pain or trouble breathing. You have any kind of injury, such as from a fall or a car crash. These symptoms may be an emergency. Call 911 right away. Do not wait to see if the symptoms will go away. Do not drive yourself to the hospital. This information is not intended to replace advice given to you by your health care provider. Make sure you discuss any questions you have with your health care provider. Document Revised: 09/01/2024 Document Reviewed: 02/20/2023 Elsevier Patient Education  2025 [employer]. Pregnancy: Healthy Eating While you're pregnant, your body needs extra nutrition for your growing baby. You also need more vitamins and minerals, such as folic acid, calcium, iron, and vitamin D. Eating a balanced diet is important for both you and your baby. Your need for extra calories will change during pregnancy. During the first 3 months of pregnancy, called the first trimester, you don't need  more calories. During the second trimester, you'll need about 340 extra calories a day. During the third trimester, you'll need about 450 extra calories a day. If you're carrying more than one baby, talk with your health care provider or a dietitian to learn more about your specific eating needs. What are tips for eating healthy during pregnancy? Meal planning  Eating smaller meals throughout the day may help manage some side effects common in pregnancy, like heartburn and reflux. Eat a variety of foods. Be sure to include many types of fruits and vegetables. Two or more servings of fish are recommended each week. Choose fish that are lower in mercury, such as salmon and pollock. Limit foods that have empty calories. These are foods that have little nutritional value, such as sweets, desserts, candies, and drinks with sugar in them. Drinks that have caffeine are OK to drink, but it's better to avoid caffeine. Limit your total caffeine intake to less than 200 mg each day, or the limit you're told by your provider. Be aware that 200 mg of caffeine is 12 oz or 355 mL of coffee, tea, or soda. General information Take a prenatal vitamin to help meet your vitamin and  mineral needs during pregnancy. This includes your need for folic acid, iron, calcium, and vitamin D. Do not try to lose weight or go on a diet during pregnancy. Food safety  Wash your hands before you eat and after you prepare raw meat. Wash all fruits and vegetables well before peeling or eating. Make sure that all meats, poultry, and eggs are cooked to food-safe temperatures or well-done. Taking these actions can help keep your food safe and protect you and your baby from dangerous food illnesses. Ask your provider for more information. What foods should I eat? Fruits All fruits. Eat a variety of colors and types of fruit. Remember to wash your fruits well before peeling or eating. Vegetables All vegetables. Eat a variety of  colors and types of vegetables. Remember to wash your vegetables well before peeling or eating. Grains All grains. Choose whole grains, such as whole-wheat bread, oatmeal, or brown rice. Meats and other protein foods Lean meats, including chicken, turkey, and lean cuts of beef, veal, or pork. Fish that is higher in omega-3 fatty acids and lower in mercury, such as salmon, herring, mussels, trout, sardines, pollock, shrimp, crab, and lobster. Tofu. Tempeh. Beans. Eggs. Peanut butter and other nut butters. Dairy Pasteurized milk and milk alternatives, such as soy milk. Pasteurized yogurt and pasteurized cheese. Cottage cheese. Sour cream. Beverages Water. Juices that contain 100% fruit juice or vegetable juice. Caffeine-free teas and decaffeinated coffee. Fats and oils Fats and oils are OK to include in moderation. Sweets and desserts Sweets and desserts are OK to include in moderation. Seasoning and other foods All pasteurized condiments. The items listed above may not be all the foods and drinks you can have. Talk with a dietitian to learn more. What does 340 extra calories look like? Healthy snacks that give you 340 more calories a day could be: Peanut butter and jelly with milk: 8 oz (237 mL) of low-fat milk. Peanut butter and jelly sandwich made with: 1 slice of whole-wheat bread. 2 teaspoons (10 g) of peanut butter. Yogurt and berries: 1 cup (245 g) of Greek yogurt. 1 cup (150 g) of berries. 2 tablespoons (30 g) of chopped nuts, such as almonds or walnuts. Avocado toast: 1 slice of whole-wheat bread. 1/2 medium avocado (70 g). 1 large egg (50 g). What foods should I avoid? Fruits Raw (unpasteurized) fruit juices. Vegetables Unpasteurized vegetable juices. Meats and other protein foods Precooked or cured meat, such as bologna, hot dogs, sausages, or meat loaves. (If you must eat those meats, reheat them until they are steaming hot.) Refrigerated pate, meat spreads from a meat  counter, or smoked seafood that's found in the refrigerated section of a store. Raw or undercooked meats, poultry, and eggs. Raw fish, such as sushi or sashimi. Fish that have high mercury content, such as tilefish, shark, swordfish, and king mackerel. Dairy Unpasteurized or raw milk and any foods that are made from them. Some of these may be: Homemade yogurts or puddings. Soft cheeses such as: Feta. Queso blanco or fresco. Pharmacist, Hospital or [place]. Blue-veined cheeses. Some of these types of cheeses may be made with pasteurized milk. Check the label. If pasteurized milk is used, they are OK to eat during pregnancy. Deli foods Premade foods from a store or deli, like chicken salad, coleslaw, or egg salad. These are riskier for food illness than fresh or homemade salads. Beverages Alcohol. Sugar-sweetened drinks, such as sodas or teas. Energy drinks. Seasoning and other foods Homemade fermented foods and drinks, such as:  Dene. Sauerkraut. Kombucha. Store-bought pasteurized versions of these are OK. The items listed above may not be all the foods and drinks you should avoid. Talk with a dietitian to learn more. Where to find more information To learn more, go to: Centers for Disease Control and Prevention at tonerpromos.no. Click Search and type food choices for pregnancy. Find the link you need. MyPlate at http://pittman-dennis.biz/. This information is not intended to replace advice given to you by your health care provider. Make sure you discuss any questions you have with your health care provider. Document Revised: 10/07/2023 Document Reviewed: 10/07/2023 Elsevier Patient Education  2025 [employer]. Exercise During Pregnancy Exercise is an important part of being healthy for people of all ages. Exercise helps your heart and lungs work well. Exercise also: Helps you stay strong and flexible. Helps you keep a healthy body weight. Boosts your energy levels and improves your mood. You  should try to exercise regularly during pregnancy. Exercise routines may need to change later in your pregnancy. In rare cases, certain medical problems in your pregnancy may limit the exercise you can do during pregnancy. Your health care provider will give you information on what exercises will work for you. How does exercise help during pregnancy? Along with staying strong and flexible, exercising during pregnancy can help: Keep strength in muscles that are used during labor and birth. Control weight gain. Speed up your recovery after giving birth. Reduce the need for insulin if you get diabetes during pregnancy. Decrease low back pain. Lower the risk for depression. Lower the risk of cesarean delivery. Treat trouble pooping (constipation). How does exercise affect my baby? Exercise can help you have a healthy pregnancy. Exercise does not cause your baby to be born early. It will not cause your baby to weigh less at birth. What exercises can I do? Many exercises are safe for you to do during pregnancy. Do a variety of exercises that safely increase your heart and breathing rates and help you build and maintain muscle strength. Do exercises as told by your provider. Your provider may recommend: Walking. Swimming. Water aerobics. Riding a stationary bike. Modified yoga or Pilates. Tell your instructor that you're pregnant. Avoid overstretching. Avoid lying on your back for long periods of time. Resistance exercises with weights or elastic bands. Running or jogging. Choose this type of exercise only if: You ran or jogged regularly before your pregnancy. You can run or jog and still talk in full sentences. What exercises should I avoid? You may be told to limit high-intensity exercise depending on your level of fitness and if you exercised regularly before you became pregnant. You can tell that you're exercising at a high intensity if you're breathing much harder and faster and can't hold a  conversation while exercising. You may be told to: Avoid jogging or running, unless you jogged or ran regularly before you became pregnant. Do not run or jog so fast that you're unable to have a conversation. Avoid activities that put you at risk for falling on your belly or getting hit in the belly. Some of these are: Downhill skiing. Rock climbing. Cycling and gymnastics. Horseback riding. Surfing and waterskiing. Contact sports. Avoid scuba diving. Avoid skydiving. Avoid activities that take place in a room that's heated to high temperatures, such as hot yoga or hot Pilates. How do I exercise in a safe way?  Start slowly. Ask your provider to recommend the types of exercise that are safe for you. Avoid overheating. Do not exercise  in very high temperatures or hot rooms. Avoid hot yoga or hot Pilates. Avoid standing still or lying flat on your back as much as you can. Avoid losing too much fluid (dehydration). Drink more fluids as told. Drink before, during, and after you exercise. Avoid overstretching. Because of hormone changes during pregnancy, it's easy to overstretch muscles, tendons, and ligaments. Ligaments are the tissues that connect bones to each other. Do not exercise to lose weight. Do not exercise at more than 6,000 feet above sea level (high elevation) if you don't live at that elevation. Tips and recommendations Wear loose-fitting, breathable clothes. Wear a sports bra to support your breasts. Exercise on most days or all days of the week. Try to exercise for 30 minutes a day, 5 days a week. If problems come up during your pregnancy, you provider may tell you to limit some exercises or to exercise less. If you have concerns, ask your provider. If you actively exercised before your pregnancy, your provider may tell you to continue to do moderate-intensity to high-intensity exercise. If you're just starting to exercise or didn't exercise much before your pregnancy, your  provider may tell you to do low-intensity to moderate-intensity exercise. Questions to ask your health care provider Is exercise safe for me? What are signs that I should stop exercising? Does my health condition mean that I should not exercise during pregnancy? When should I avoid exercising during pregnancy? Stop exercising and contact a health care provider if: You have any unusual symptoms, such as: Mild contractions or cramps in the belly. Dizziness that does not go away when you rest. Headache. Pain and swelling of your calves. Bleeding or fluid leaking from your vagina. Stop exercising and get help right away if: You have: Chest pain. Shortness of breath. Sudden, severe pain in your low back or your belly. Regular, painful contractions before 37 weeks of pregnancy. These symptoms may be an emergency. Call 911 right away. Do not wait to see if the symptoms will go away. Do not drive yourself to the hospital. This information is not intended to replace advice given to you by your health care provider. Make sure you discuss any questions you have with your health care provider. Document Revised: 06/15/2023 Document Reviewed: 06/15/2023 Elsevier Patient Education  2024 [employer] Stretched Tissue in the Pelvic Area Causing Pain During Pregnancy (Round Ligament Pain): What It Means  Round ligament pain is common during pregnancy. The round ligaments are two cord-like parts that hold up the uterus. They stretch and get soft as the baby grows, which can cause pain.  This pain usually starts in the second trimester (13-28 weeks) of pregnancy and does not hurt the baby. The pain is usually sharp and feels like a pinch. It lasts only a few seconds but can come and go until the baby is born. The pain may be felt in the belly, hip, or groin. The pain comes when: You change positions quickly, like when you go from a sitting to a standing position. You do physical activity. You cough,  laugh, or sneeze. Follow these instructions at home: How to manage round ligament pain: Bend your knees up to your belly when you need to sneeze, laugh, or cough. Lie on your side with one pillow under your belly and another pillow between your legs. Sit in a warm bath for 15-20 minutes or until the pain goes away. Rest as told. Take your medicines only as told. General instructions Stop or reduce your physical  activities if they cause pain. Move slowly when you sit down or stand up. Avoid sudden movements. Use a support belt under your belly. Do stretching exercises. Contact a health care provider if: Your pain doesn't go away or gets worse. You feel pain in your back that you didn't have before. Your medicine isn't helping. You have fever or chills. You throw up, or you feel like throwing up. You have watery poop (diarrhea). You have pain when you pee. Get help right away if: Your back pain is a rhythmic, cramping pain similar to labor pains. Labor pains are usually 2 minutes apart and last for about 1 minute. Labor pains involve a bearing-down feeling or pressure in your pelvis. You have back pain and your water breaks. You have bleeding from your vagina. These symptoms may be an emergency. Call 911 right away. Do not wait to see if the symptoms will go away. Do not drive yourself to the hospital. This information is not intended to replace advice given to you by your health care provider. Make sure you discuss any questions you have with your health care provider. Document Revised: 04/04/2024 Document Reviewed: 04/04/2024 Elsevier Patient Education  2025 [employer].

## 2024-12-06 NOTE — Assessment & Plan Note (Signed)
 <>  Rhogam at 28 wks

## 2024-12-07 LAB — CERVICOVAGINAL ANCILLARY ONLY
Bacterial Vaginitis (gardnerella): NEGATIVE
Candida Glabrata: NEGATIVE
Candida Vaginitis: NEGATIVE
Chlamydia: NEGATIVE
Comment: NEGATIVE
Comment: NEGATIVE
Comment: NEGATIVE
Comment: NEGATIVE
Comment: NEGATIVE
Comment: NORMAL
Neisseria Gonorrhea: NEGATIVE
Trichomonas: NEGATIVE

## 2024-12-07 LAB — THYROID PANEL WITH TSH
Free Thyroxine Index: 2.2 (ref 1.2–4.9)
T3 Uptake Ratio: 19 % — ABNORMAL LOW (ref 24–39)
T4, Total: 11.7 ug/dL (ref 4.5–12.0)
TSH: 3.44 u[IU]/mL (ref 0.450–4.500)

## 2024-12-07 LAB — CBC/D/PLT+RPR+RH+ABO+RUBIGG...
Antibody Screen: NEGATIVE
Basophils Absolute: 0 10*3/uL (ref 0.0–0.2)
Basos: 0 %
EOS (ABSOLUTE): 0 10*3/uL (ref 0.0–0.4)
Eos: 1 %
HCV Ab: NONREACTIVE
HIV Screen 4th Generation wRfx: NONREACTIVE
Hematocrit: 40.8 % (ref 34.0–46.6)
Hemoglobin: 13.3 g/dL (ref 11.1–15.9)
Hepatitis B Surface Ag: NEGATIVE
Immature Grans (Abs): 0 10*3/uL (ref 0.0–0.1)
Immature Granulocytes: 0 %
Lymphocytes Absolute: 1.2 10*3/uL (ref 0.7–3.1)
Lymphs: 26 %
MCH: 24.9 pg — ABNORMAL LOW (ref 26.6–33.0)
MCHC: 32.6 g/dL (ref 31.5–35.7)
MCV: 76 fL — ABNORMAL LOW (ref 79–97)
Monocytes Absolute: 0.3 10*3/uL (ref 0.1–0.9)
Monocytes: 5 %
Neutrophils Absolute: 3.2 10*3/uL (ref 1.4–7.0)
Neutrophils: 68 %
Platelets: 235 10*3/uL (ref 150–450)
RBC: 5.34 x10E6/uL — ABNORMAL HIGH (ref 3.77–5.28)
RDW: 15.4 % (ref 11.7–15.4)
RPR Ser Ql: NONREACTIVE
Rh Factor: NEGATIVE
Rubella Antibodies, IGG: 23.8 {index}
Varicella zoster IgG: REACTIVE
WBC: 4.7 10*3/uL (ref 3.4–10.8)

## 2024-12-07 LAB — URINALYSIS, ROUTINE W REFLEX MICROSCOPIC
Bilirubin, UA: NEGATIVE
Glucose, UA: NEGATIVE
Ketones, UA: NEGATIVE
Leukocytes,UA: NEGATIVE
Nitrite, UA: NEGATIVE
Protein,UA: NEGATIVE
RBC, UA: NEGATIVE
Specific Gravity, UA: 1.009 (ref 1.005–1.030)
Urobilinogen, Ur: 0.2 mg/dL (ref 0.2–1.0)
pH, UA: 6.5 (ref 5.0–7.5)

## 2024-12-07 LAB — HCV INTERPRETATION

## 2024-12-07 LAB — VITAMIN B12: Vitamin B-12: 274 pg/mL (ref 232–1245)

## 2024-12-08 LAB — MONITOR DRUG PROFILE 14(MW)
Amphetamine Scrn, Ur: NEGATIVE ng/mL
BARBITURATE SCREEN URINE: NEGATIVE ng/mL
BENZODIAZEPINE SCREEN, URINE: NEGATIVE ng/mL
Buprenorphine, Urine: NEGATIVE ng/mL
CANNABINOIDS UR QL SCN: NEGATIVE ng/mL
Cocaine (Metab) Scrn, Ur: NEGATIVE ng/mL
Creatinine(Crt), U: 61.9 mg/dL (ref 20.0–300.0)
Fentanyl, Urine: NEGATIVE pg/mL
Meperidine Screen, Urine: NEGATIVE ng/mL
Methadone Screen, Urine: NEGATIVE ng/mL
OXYCODONE+OXYMORPHONE UR QL SCN: NEGATIVE ng/mL
Opiate Scrn, Ur: NEGATIVE ng/mL
Ph of Urine: 6.1 (ref 4.5–8.9)
Phencyclidine Qn, Ur: NEGATIVE ng/mL
Propoxyphene Scrn, Ur: NEGATIVE ng/mL
SPECIFIC GRAVITY: 1.008
Tramadol Screen, Urine: NEGATIVE ng/mL

## 2024-12-08 LAB — URINE CULTURE, OB REFLEX

## 2024-12-08 LAB — NICOTINE SCREEN, URINE: Cotinine Ql Scrn, Ur: NEGATIVE ng/mL

## 2024-12-08 LAB — CULTURE, OB URINE

## 2025-01-03 ENCOUNTER — Encounter: Admitting: Certified Nurse Midwife

## 2025-01-23 ENCOUNTER — Encounter: Payer: Self-pay | Admitting: Nurse Practitioner

## 2025-01-31 ENCOUNTER — Other Ambulatory Visit
